# Patient Record
Sex: Female | Born: 1970 | Race: White | Hispanic: No | Marital: Married | Smoking: Never smoker
Health system: Southern US, Community
[De-identification: ages and names within clinical notes are randomized; demographics above are authoritative.]

## PROBLEM LIST (undated history)

## (undated) DIAGNOSIS — J45909 Unspecified asthma, uncomplicated: Secondary | ICD-10-CM

## (undated) DIAGNOSIS — M1990 Unspecified osteoarthritis, unspecified site: Secondary | ICD-10-CM

## (undated) DIAGNOSIS — E079 Disorder of thyroid, unspecified: Secondary | ICD-10-CM

## (undated) DIAGNOSIS — I10 Essential (primary) hypertension: Secondary | ICD-10-CM

## (undated) DIAGNOSIS — R87619 Unspecified abnormal cytological findings in specimens from cervix uteri: Secondary | ICD-10-CM

## (undated) DIAGNOSIS — N6091 Unspecified benign mammary dysplasia of right breast: Secondary | ICD-10-CM

## (undated) DIAGNOSIS — T7840XA Allergy, unspecified, initial encounter: Secondary | ICD-10-CM

## (undated) DIAGNOSIS — R519 Headache, unspecified: Secondary | ICD-10-CM

## (undated) DIAGNOSIS — B009 Herpesviral infection, unspecified: Secondary | ICD-10-CM

## (undated) DIAGNOSIS — N979 Female infertility, unspecified: Secondary | ICD-10-CM

## (undated) DIAGNOSIS — F419 Anxiety disorder, unspecified: Secondary | ICD-10-CM

## (undated) DIAGNOSIS — E039 Hypothyroidism, unspecified: Secondary | ICD-10-CM

## (undated) DIAGNOSIS — IMO0002 Reserved for concepts with insufficient information to code with codable children: Secondary | ICD-10-CM

## (undated) DIAGNOSIS — R51 Headache: Secondary | ICD-10-CM

## (undated) DIAGNOSIS — F319 Bipolar disorder, unspecified: Secondary | ICD-10-CM

## (undated) DIAGNOSIS — N809 Endometriosis, unspecified: Secondary | ICD-10-CM

## (undated) HISTORY — PX: OTHER SURGICAL HISTORY: SHX169

## (undated) HISTORY — PX: BREAST BIOPSY: SHX20

## (undated) HISTORY — PX: AUGMENTATION MAMMAPLASTY: SUR837

## (undated) HISTORY — DX: Endometriosis, unspecified: N80.9

## (undated) HISTORY — DX: Disorder of thyroid, unspecified: E07.9

## (undated) HISTORY — DX: Reserved for concepts with insufficient information to code with codable children: IMO0002

## (undated) HISTORY — PX: BREAST EXCISIONAL BIOPSY: SUR124

## (undated) HISTORY — PX: COLONOSCOPY: SHX174

## (undated) HISTORY — DX: Female infertility, unspecified: N97.9

## (undated) HISTORY — DX: Unspecified abnormal cytological findings in specimens from cervix uteri: R87.619

## (undated) HISTORY — DX: Allergy, unspecified, initial encounter: T78.40XA

## (undated) HISTORY — DX: Anxiety disorder, unspecified: F41.9

## (undated) HISTORY — DX: Herpesviral infection, unspecified: B00.9

## (undated) HISTORY — PX: SINUSOTOMY: SHX291

---

## 1997-08-17 ENCOUNTER — Other Ambulatory Visit: Admission: RE | Admit: 1997-08-17 | Discharge: 1997-08-17 | Payer: Self-pay | Admitting: *Deleted

## 1998-07-16 ENCOUNTER — Encounter (INDEPENDENT_AMBULATORY_CARE_PROVIDER_SITE_OTHER): Payer: Self-pay | Admitting: *Deleted

## 1998-07-16 ENCOUNTER — Ambulatory Visit (HOSPITAL_COMMUNITY): Admission: RE | Admit: 1998-07-16 | Discharge: 1998-07-16 | Payer: Self-pay | Admitting: Obstetrics and Gynecology

## 1998-07-17 ENCOUNTER — Other Ambulatory Visit: Admission: RE | Admit: 1998-07-17 | Discharge: 1998-07-17 | Payer: Self-pay | Admitting: *Deleted

## 1998-12-14 ENCOUNTER — Ambulatory Visit (HOSPITAL_COMMUNITY): Admission: RE | Admit: 1998-12-14 | Discharge: 1998-12-14 | Payer: Self-pay | Admitting: Obstetrics and Gynecology

## 1999-07-03 ENCOUNTER — Other Ambulatory Visit: Admission: RE | Admit: 1999-07-03 | Discharge: 1999-07-03 | Payer: Self-pay | Admitting: Obstetrics and Gynecology

## 2000-07-07 ENCOUNTER — Other Ambulatory Visit: Admission: RE | Admit: 2000-07-07 | Discharge: 2000-07-07 | Payer: Self-pay | Admitting: Obstetrics and Gynecology

## 2001-01-18 ENCOUNTER — Other Ambulatory Visit: Admission: RE | Admit: 2001-01-18 | Discharge: 2001-01-18 | Payer: Self-pay | Admitting: Obstetrics and Gynecology

## 2001-07-13 ENCOUNTER — Other Ambulatory Visit: Admission: RE | Admit: 2001-07-13 | Discharge: 2001-07-13 | Payer: Self-pay | Admitting: Obstetrics and Gynecology

## 2001-07-19 ENCOUNTER — Encounter: Admission: RE | Admit: 2001-07-19 | Discharge: 2001-07-19 | Payer: Self-pay | Admitting: Obstetrics and Gynecology

## 2001-07-19 ENCOUNTER — Encounter: Payer: Self-pay | Admitting: Obstetrics and Gynecology

## 2001-11-05 ENCOUNTER — Other Ambulatory Visit: Admission: RE | Admit: 2001-11-05 | Discharge: 2001-11-05 | Payer: Self-pay | Admitting: Obstetrics and Gynecology

## 2002-07-20 ENCOUNTER — Other Ambulatory Visit: Admission: RE | Admit: 2002-07-20 | Discharge: 2002-07-20 | Payer: Self-pay | Admitting: Obstetrics and Gynecology

## 2002-08-29 ENCOUNTER — Encounter: Payer: Self-pay | Admitting: Obstetrics and Gynecology

## 2002-08-29 ENCOUNTER — Encounter: Admission: RE | Admit: 2002-08-29 | Discharge: 2002-08-29 | Payer: Self-pay | Admitting: Obstetrics and Gynecology

## 2003-07-26 ENCOUNTER — Other Ambulatory Visit: Admission: RE | Admit: 2003-07-26 | Discharge: 2003-07-26 | Payer: Self-pay | Admitting: Obstetrics and Gynecology

## 2003-09-11 ENCOUNTER — Encounter: Admission: RE | Admit: 2003-09-11 | Discharge: 2003-09-11 | Payer: Self-pay | Admitting: Obstetrics and Gynecology

## 2003-09-18 ENCOUNTER — Encounter: Admission: RE | Admit: 2003-09-18 | Discharge: 2003-09-18 | Payer: Self-pay | Admitting: Obstetrics and Gynecology

## 2004-07-05 ENCOUNTER — Other Ambulatory Visit: Admission: RE | Admit: 2004-07-05 | Discharge: 2004-07-05 | Payer: Self-pay | Admitting: Obstetrics and Gynecology

## 2004-09-16 ENCOUNTER — Encounter: Admission: RE | Admit: 2004-09-16 | Discharge: 2004-09-16 | Payer: Self-pay | Admitting: Obstetrics and Gynecology

## 2004-12-21 ENCOUNTER — Inpatient Hospital Stay (HOSPITAL_COMMUNITY): Admission: AD | Admit: 2004-12-21 | Discharge: 2004-12-21 | Payer: Self-pay | Admitting: Obstetrics and Gynecology

## 2005-07-03 ENCOUNTER — Inpatient Hospital Stay (HOSPITAL_COMMUNITY): Admission: RE | Admit: 2005-07-03 | Discharge: 2005-07-07 | Payer: Self-pay | Admitting: Obstetrics and Gynecology

## 2005-07-03 ENCOUNTER — Encounter (INDEPENDENT_AMBULATORY_CARE_PROVIDER_SITE_OTHER): Payer: Self-pay | Admitting: Specialist

## 2005-07-14 ENCOUNTER — Ambulatory Visit: Admission: RE | Admit: 2005-07-14 | Discharge: 2005-07-14 | Payer: Self-pay | Admitting: Obstetrics and Gynecology

## 2005-08-14 ENCOUNTER — Other Ambulatory Visit: Admission: RE | Admit: 2005-08-14 | Discharge: 2005-08-14 | Payer: Self-pay | Admitting: Obstetrics and Gynecology

## 2006-04-13 ENCOUNTER — Encounter: Admission: RE | Admit: 2006-04-13 | Discharge: 2006-04-13 | Payer: Self-pay | Admitting: Obstetrics and Gynecology

## 2006-08-05 ENCOUNTER — Ambulatory Visit (HOSPITAL_COMMUNITY): Admission: RE | Admit: 2006-08-05 | Discharge: 2006-08-05 | Payer: Self-pay | Admitting: Obstetrics and Gynecology

## 2006-10-15 ENCOUNTER — Ambulatory Visit (HOSPITAL_BASED_OUTPATIENT_CLINIC_OR_DEPARTMENT_OTHER): Admission: RE | Admit: 2006-10-15 | Discharge: 2006-10-15 | Payer: Self-pay | Admitting: Orthopedic Surgery

## 2007-04-14 ENCOUNTER — Encounter: Admission: RE | Admit: 2007-04-14 | Discharge: 2007-04-14 | Payer: Self-pay | Admitting: Obstetrics and Gynecology

## 2007-05-03 ENCOUNTER — Encounter: Admission: RE | Admit: 2007-05-03 | Discharge: 2007-05-03 | Payer: Self-pay | Admitting: Obstetrics and Gynecology

## 2008-04-28 ENCOUNTER — Encounter: Admission: RE | Admit: 2008-04-28 | Discharge: 2008-04-28 | Payer: Self-pay | Admitting: Obstetrics and Gynecology

## 2009-04-30 ENCOUNTER — Encounter: Admission: RE | Admit: 2009-04-30 | Discharge: 2009-04-30 | Payer: Self-pay | Admitting: Obstetrics and Gynecology

## 2010-02-17 ENCOUNTER — Encounter: Payer: Self-pay | Admitting: Obstetrics and Gynecology

## 2010-05-30 ENCOUNTER — Other Ambulatory Visit: Payer: Self-pay | Admitting: Obstetrics and Gynecology

## 2010-05-30 DIAGNOSIS — Z1231 Encounter for screening mammogram for malignant neoplasm of breast: Secondary | ICD-10-CM

## 2010-06-10 ENCOUNTER — Ambulatory Visit
Admission: RE | Admit: 2010-06-10 | Discharge: 2010-06-10 | Disposition: A | Discharge status: Home / Self Care | Payer: BC Managed Care – PPO | Source: Ambulatory Visit | Attending: Obstetrics and Gynecology | Admitting: Obstetrics and Gynecology

## 2010-06-10 DIAGNOSIS — Z1231 Encounter for screening mammogram for malignant neoplasm of breast: Secondary | ICD-10-CM

## 2010-06-11 NOTE — Op Note (Signed)
Jessica Navarro, BROADEN NO.:  [id number]   MEDICAL RECORD NO.:  [id number]          PATIENT TYPE:  AMB   LOCATION:  [facility]                         FACILITY:  [facility]   PHYSICIAN:  Deidre Ala, M.D.    DATE OF BIRTH:  November 17, 1970   DATE OF PROCEDURE:  10/15/2006  DATE OF DISCHARGE:                               OPERATIVE REPORT   PREOPERATIVE DIAGNOSIS:  Painful osteophyte right fifth toe proximal  interphalangeal joint dorsal lateral surface.   POSTOPERATIVE DIAGNOSIS:  Painful osteophyte right fifth toe proximal  interphalangeal joint dorsal lateral surface.   PROCEDURE:  Phalangeal resection of right phalangeal base of distal  right proximal interphalangeal joint proximal phalanx with osteophyte  removal and soft tissue excision and repair.   SURGEON:  1. Charlesetta Shanks, M.D.   ASSISTANT:  Phineas Semen, P.A.-C.   ANESTHESIA:  Ankle block.   CULTURES:  None.   DRAINS:  None.   ESTIMATED BLOOD LOSS:  Minimal.   TOURNIQUET TIME:  Esmarch, 21 minutes.   PATHOLOGIC FINDINGS AND HISTORY:  Jessica Navarro is having trouble wearing shoes  due to this painful osteophyte with overlying soft tissue callus.  We  ellipsed the skin out to contract it over top and dissected down to the  osteophyte on the dorsolateral surface of the phalangeal base of the  distal portion of the proximal phalanx of the PIP joint, completely  smoothing it with a rongeur and rasp.   PROCEDURE:  After adequate anesthesia obtained using ankle block  technique and 1 gram of Ancef given IV for prophylaxis, the patient was  placed in the supine position.  The right foot was prepped and draped  from the toes to the ankle in the standard fashion.  After standard  prepping and draping, Esmarch exsanguination was used and left on above  the ankle.  I then made an elliptical incision, ellipsing a small amount  of skin out over the hard osteophyte at the PIP joint dorsal lateral  surface and dissected down  under loupe magnification.  I then retracted  and removed with a rongeur and rasp the osteophyte, irrigated and closed  the skin with vertical mattress sutures of 4-0 nylon.  This left smooth,  somewhat contracted skin over the area where it had been expanded by the  osteophyte.  Bulky sterile compressive dressing was applied with Ace.  The patient having tolerated the procedure well, was awakened  and taken to the recovery room in satisfactory condition to be  discharged per outpatient routine, crutches, weightbearing as tolerated  on her heel, Vicodin for pain and a wooden sole shoe.  Told to call the  office for appointment for recheck on Monday.           ______________________________  V. Charlesetta Shanks, M.D.     VEP/MEDQ  D:  10/15/2006  T:  10/15/2006  Job:  [id number]

## 2010-06-14 NOTE — Op Note (Signed)
Jessica Navarro, Jessica Navarro           ACCOUNT NO.:  [id number]   MEDICAL RECORD NO.:  [id number]          PATIENT TYPE:  INP   LOCATION:  9146                          FACILITY:  [facility]   PHYSICIAN:  Dois Davenport A. Rivard, M.D. DATE OF BIRTH:  [birth year]   DATE OF PROCEDURE:  07/03/2005  DATE OF DISCHARGE:                                 OPERATIVE REPORT   PREOPERATIVE DIAGNOSES:  1.  Intrauterine pregnancy at 36 weeks  day.  2.  Twins, breech and breech presentation.   POSTOPERATIVE DIAGNOSES:  1.  Intrauterine pregnancy at 36 weeks  day.  2.  Twins, breech and breech presentation.   ANESTHESIA:  Spinal, Quillian Quince, M.D.   PROCEDURE:  Primary low transverse cesarean section.   SURGEON:  Crist Fat. Rivard, M.D.   ASSISTANTRenaldo Reel. Latham, C.N.M.   ESTIMATED BLOOD LOSS:  600 mL.   PROCEDURE:  After being informed of the planned procedure with possible  complications including bleeding, infection, injury to bowel, bladder or  ureters, informed consent is obtained.  The patient is taken to cesarean  suite, given spinal anesthesia without complications and placed the dorsal  decubitus position, pelvis tilted to the left.  She is prepped and draped in  a sterile fashion and a Foley catheter is inserted in her bladder.   After assessing adequate level of anesthesia, we infiltrate the suprapubic  area using 20 mL of Marcaine 0.25T and perform a Pfannenstiel incision,  which was brought down to the fascia.  Fascia is then incised in a low  transverse fashion.  Linea alba is dissected.  Peritoneum is entered sharply  in a midline fashion.  Visceral peritoneum is entered in a low transverse  fashion, allowing [place] to safely retract bladder by developing a bladder flap,  and the myometrium is then entered in a low transverse fashion, first with  knife, then extended bluntly.  Amniotic fluid of baby A is clear.  We assist  the birth of a female infant in the complete breech presentation at  10:45 a.m.  Mouth and nose are suctioned.  Cord is clamped with a cord clamp and a Kelly  and sectioned and the baby is given to the pediatrician present in the room.  Amniotic fluid of baby B is clear.  We assist the birth of a female infant  born at 10:46 a.m. and also complete breech presentation.  Mouth and nose  are suctioned.  Cord is clamped with two Kelly clamps and sectioned and baby  is given to the pediatrician present in the room.  We then proceed with cord  blood drawing for private cord blood banking on both babies.  Eight  milliliters of blood is then drawn from the and umbilical vein for baby B  and 7 mL from baby A for labs.  Placentas are then delivered spontaneously.  Both placentas are complete, both blood cords have three vessels, and these  are sent for pathology.  The uterine revision is negative.  Ancef 2 g is  given to the patient and we proceed with closure of the myometrium in two  layers,  first with a running locked suture of 0 Vicryl, then with a Lembert  suture of 0 Vicryl imbricating the first one.  Hemostasis is completed on  peritoneal edges with cautery.  Both paracolic gutters are cleaned.  Both  tubes and ovaries are assessed and normal.  Appendix is also visualized and  normal.  The pelvis is then irrigated profusely with warm saline.  Hemostasis is rechecked and deemed adequate.   Under fascia hemostasis is completed with cautery and the fascia is closed  with two running sutures of #1 Vicryl meeting midline.  The wound is then  irrigated with warm saline.  Hemostasis is completed with cautery and the  skin is closed with a subcuticular suture of 3-0 Monocryl and Steri-Strips.   Instrument and sponge count is complete x2.  Estimated blood loss is 600 mL.  The procedure is very well tolerated by the patient, who is taken to  recovery room in a well and stable condition.   Baby A, a little boy, was named Jean Rosenthal, was born at 10:45 a.m., weighed 5   pounds 3 ounces and received an Apgar of 8 at one minute and 9 at five  minutes.  Baby B, a little girl named [name], was born at 10:46, weighed 4  pounds 15 ounces and received an Apgar of 8 at one minute and 9 at five  minutes.      Crist Fat Rivard, M.D.  Electronically Signed     SAR/MEDQ  D:  07/03/2005  T:  07/04/2005  Job:  [id number]

## 2010-06-14 NOTE — Discharge Summary (Signed)
[name], Jessica Navarro           ACCOUNT NO.:  [id number]   MEDICAL RECORD NO.:  [id number]          PATIENT TYPE:  INP   LOCATION:  9146                          [facility]ACILITY[facility]   PHYSICIAN:  Dois Davenport A. Rivard, M.D. DATE OF BIRTH:  08/16/1970   DATE OF ADMISSION:  07/03/2005  DATE OF DISCHARGE:  07/07/2005                                 DISCHARGE SUMMARY   ADDENDUM:    Discharge summary as written yesterday with the patient experiencing  difficulties with migraine headaches and baby is unable to be discharged due  to growing and feeding issues.  The patient worked with the Hotel manager yesterday and will continue today as babies are evaluated for  discharge.  She took medication for migraine headaches yesterday morning and  has been migraine-free since that time.  Her incision remains clean, dry and  intact.  Her vital signs are stable.  She is afebrile and on this, her  fourth postoperative day, she will be discharged in stable condition.      Rica Koyanagi, C.N.M.      Crist Fat Rivard, M.D.  Electronically Signed    SDM/MEDQ  D:  07/07/2005  T:  07/07/2005  Job:  [id number]

## 2010-06-14 NOTE — H&P (Signed)
NAMEJADZIA, IBSEN NO.:  [id number]   MEDICAL RECORD NO.:  [id number]          PATIENT TYPE:  INP   LOCATION:  NA                            [facility]   PHYSICIAN:  Dois Davenport A. Rivard, M.D. DATE OF BIRTH:  12-06-1970   DATE OF ADMISSION:  07/03/2005  DATE OF DISCHARGE:                                HISTORY & PHYSICAL   Ms. Mick is a 40 year old, gravida 1, para 0, at 36-1/7 weeks, who  presents for scheduled primary cesarean section secondary to twin gestation  with fetuses in a breach-breach presentation.   Pregnancy has been remarkable for:  1.  Advanced maternal age with patient having first trimester screening.      She declined amniocentesis.  2.  In vitro fertilization pregnancy.  3.  Migraines.  4.  History of HSV-2.  5.  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   PRENATAL LABORATORY DATA:  Blood type O positive. Rh antibody negative. VDRL  nonreactive.  Rubella titer positive. Hepatitis B surface antigen negative.  HIV nonreactive.  Varicella titer was immune.  Cystic fibrosis testing  showed the patient to be a carrier, but the father of the baby is negative.  GC and chlamydia cultures were negative in August.  Pap was normal in 2006.  Hemoglobin upon entering the practice was 13.1.  It was within normal limits  at 28 weeks.  Group B strep culture not noted on patient's chart at present.  EDC of July 30, 2005, was established by ultrasound at 9 weeks.  Glucola was  normal.  First trimester screening was normal.  She declined quadruple  screen.   HISTORY OF PRESENT PREGNANCY:  The patient entered care at approximately 12  weeks.  She had achieved a twin pregnancy via in vitro fertilization with  the [facility] program and Dr. Chevis Pretty as her infertility physician.  First  trimester screening was requested and was done.  It was normal.  Migraines  were significant prior to pregnancy and at the beginning of pregnancy.  They  did  improve somewhat. She did have trigger point injections.  She had an  ultrasound at approximately 9 weeks with Dr. Chevis Pretty with an [facility] given of July 30, 2005.  She had some lower abdominal cramping at 15 weeks.  Findings were  within normal limits.  She had an ultrasound on January 7 at [facility]  showing a diachronic diamnionic twin pregnancy with normal fetal anatomy x2,  normal nuchal translucency x2, and normal amniotic fluid volume.  She had  worsening migraine in January. This was treated by the headache center.  She  began to use Demerol and Phenergan under doctor's care. She had an  ultrasound at 13 weeks showing again congruency with an [facility] of July 30, 2005.  At 18 weeks, twin A was in a vertex presentation.  Placenta was posterior.  Twin B was vertex as well with posterior placenta.  Down's syndrome risk  diminished to 1:10,000 x2 on that ultrasound. Cervix was 3.91 cm long.  She  got a prescription for Valtrex therapy at 22 weeks.  She  was having more  frequent outbreaks and began to take Valtrex every day at approximately 22  weeks.  She had another ultrasound at 24 to 25 weeks showing normal growth  of both babies.  A was breach; B was vertex.  Cervix was normal.  At 29  weeks, she had a Glucola that was normal.  She was placed on iron with  slightly low iron.  She had another ultrasound at 29 weeks showing A breach  and B transverse with normal growth.  Cervix was closed and long.  At 33-  week ultrasound she also had, twin A was breach at 4 pounds 1 ounce at 67%.  Twin B was breach at 4 pounds 8 ounces at 35 percentile, normal fluid.  She  had decided to try Depo-Provera after delivery followed by __________, and  the decision was made to proceed with scheduled C section on June 7.  She  was declining circumcision, and she was considering private cord blood  banking.  She had an ultrasound at 35 weeks showing twin A at 5 pounds 10  ounces, normal fluid, and breach  presentation.  B: 5 pounds 9 ounces, normal  fluid, and breach presentation.  Cervix was 3 cm long.   OBSTETRICAL HISTORY:  The patient is a primigravida.  She did have in vitro  fertilization performed on November 06, 2004.  She was seeing Dr. Estanislado Pandy in  reach in [place].   PAST MEDICAL HISTORY:  1.  She had a laparoscopy for endometriosis in 2000.  2.  She was diagnosed with HSV about 7 years ago.  3.  She was a previous contraceptive user x2 years.  4.  She had an abnormal Pap in 2004.  5.  She has history of childhood asthma.  6.  She has had one bladder infection in the last year.  7.  She does have history of migraines.   PAST SURGICAL HISTORY:  Includes wisdom teeth extraction in 1990.   ALLERGIES:  She is allergic to SULFA.  She also has some mild sensitivity to  LATEX.   FAMILY HISTORY:  Her brother has chronic hypertension.  Her mother has  tuberculosis and has been treated.  Her paternal grandfather was diabetic.  Her brother had parathyroid removed.  Brother also has kidney problems and  is on dialysis.  Her mother and father had migraines.  Her brother had  seizure disorder as a child. Paternal grandfather had stomach cancer.  Her  mother is a smoker.  Her mother is also adopted.   GENETIC HISTORY:  Remarkable for father of the baby having a heart murmur  and the patient's father being a twin.   SOCIAL HISTORY:  The patient is married to the father of the baby.  He is  involved and supportive.  His name is Radiographer, therapeutic. The patient has  years of cosmetology school, and she is employed as a Associate Professor.  Her  husband has an 11th grade education.  He is employed as a Quarry manager.  She  has been followed by the physician's service at [facility].  She  denies any alcohol, drug, or tobacco use during this pregnancy.   PHYSICAL EXAMINATION:  VITAL SIGNS: Stable.  The patient is afebrile.  HEENT: Within normal limits. LUNGS:  Breath sounds are clear.   HEART: Regular rate and rhythm without murmur.  BREASTS: Soft and nontender.  ABDOMEN: Fundal height is approximately 40 cm.  Estimated fetal weights are  in the high 5  pound range.  The patient has very occasional contractions.  PELVIC:  Exam is deferred.  Fetal heart tones were in the 130s on last  examination in the office.  EXTREMITIES:  Deep tendon reflexes are 2+ without clonus.  There is trace  edema noted.   IMPRESSION:  1.  Intrauterine twin pregnancy at 36-1/7 weeks.  2.  Breach-breach presentation with patient desiring scheduled cesarean      section.  3.  History of herpesvirus-2 with no recent or current lesions.  4.  History of migraines.  5.  In vitro fertilization pregnancy.  6.  The patient is a cystic fibrosis carrier with father of the baby      negative.   PLAN:  1.  Admit to [facility] for consult with Dr. Silverio Lay as attending physician.  2.  Routine physician preoperative orders.      Renaldo Reel Emilee Hero, C.N.M.      Crist Fat Rivard, M.D.  Electronically Signed    VLL/MEDQ  D:  07/01/2005  T:  07/01/2005  Job:  [id number]

## 2010-06-14 NOTE — Discharge Summary (Signed)
Jessica Navarro, BURKEL NO.:  [id number]   MEDICAL RECORD NO.:  [id number]          PATIENT TYPE:  INP   LOCATION:  9146                          [facility]ACILITY[facility]   PHYSICIAN:  Hal Morales, M.D.DATE OF BIRTH:  1970/05/05   DATE OF ADMISSION:  07/03/2005  DATE OF DISCHARGE:  07/06/2005                                 DISCHARGE SUMMARY   ADMITTING DIAGNOSIS:  Intrauterine twin gestation at 36-1/7 weeks, breech  presentation with the patient desiring scheduled cesarean section.   PROCEDURE:  Primary low transverse cesarean section.   DISCHARGE DIAGNOSIS:  Intrauterine pregnancy at 36-1/7 weeks, delivered twin  gestation, breechpresentation.   HOSPITAL COURSE:  [name] is a 40 year old, gravida 1, para 0, who  presented at 36-1/7 weeks for scheduled primary cesarean delivery secondary  to twins at gestation with a breechpresentation.  This was performed on July 03, 2005 by Dr. Dois Davenport Rivard.  The patient gave birth to baby A, Jean Rosenthal  with Apgar scores of 8 at 1 minute 9 at 5 minutes.  Baby weighed 5 pounds 3  ounces and has done well in the postoperative period.  Baby B, Isabell,  Apgar scores 8 at 1 minute and 9 at  minutes with weight 4 pounds 15 ounces.  Babies have done well in the postoperative period as well.  The patient has  done well.  Her vital signs remained stable.  She is afebrile.  The only  complication has been migraine headaches, which the patient has had  throughout her pregnancy and has a history prior to pregnancy.  She has used  [facility] effectively through her pregnancy, as she has been seen by  [facility].  She was therefore ordered  this medication in the postoperative period and it has worked well.  On this  her fourth postoperative day her vital signs are stable.  She is afebrile.  Her incision is clean, dry and intact.  Her babies are well and ready for  discharge on the fourth, rather  than the third postoperative day, and the  patient is deemed to be in satisfactory condition for discharge.   DISCHARGE INSTRUCTIONS:  Per [facility] handout.   DISCHARGE MEDICATIONS:  1.  Motrin 600 mg p.o. q.6 h., p.r.n. pain.  2.  Tylox 1-2 p.o. q.3-4h pain.  3.  Prenatal vitamins.  4.  The patient was given a prescription for [facility], 1 p.o. q.4-      6h., p.r.n. migraine headaches.   The patient will follow up with [facility] as  needed.  She will follow up at the office of [facility] in 6 weeks or p.r.n.      Rica Koyanagi, C.N.M.      Hal Morales, M.D.  Electronically Signed    SDM/MEDQ  D:  07/06/2005  T:  07/07/2005  Job:  [id number]

## 2010-11-07 LAB — POCT HEMOGLOBIN-HEMACUE: Operator id: 134391

## 2010-11-07 LAB — POCT PREGNANCY, URINE
Operator id: 134391
Preg Test, Ur: NEGATIVE

## 2011-04-14 ENCOUNTER — Encounter (INDEPENDENT_AMBULATORY_CARE_PROVIDER_SITE_OTHER): Payer: BC Managed Care – PPO | Admitting: Obstetrics and Gynecology

## 2011-04-14 DIAGNOSIS — N949 Unspecified condition associated with female genital organs and menstrual cycle: Secondary | ICD-10-CM

## 2011-05-13 ENCOUNTER — Other Ambulatory Visit: Payer: Self-pay | Admitting: Obstetrics and Gynecology

## 2011-05-13 DIAGNOSIS — Z1231 Encounter for screening mammogram for malignant neoplasm of breast: Secondary | ICD-10-CM

## 2011-06-12 ENCOUNTER — Ambulatory Visit: Payer: BC Managed Care – PPO

## 2011-06-25 ENCOUNTER — Ambulatory Visit: Payer: BC Managed Care – PPO

## 2011-08-25 ENCOUNTER — Ambulatory Visit
Admission: RE | Admit: 2011-08-25 | Discharge: 2011-08-25 | Disposition: A | Discharge status: Home / Self Care | Payer: BC Managed Care – PPO | Source: Ambulatory Visit | Attending: Obstetrics and Gynecology | Admitting: Obstetrics and Gynecology

## 2011-08-25 DIAGNOSIS — Z1231 Encounter for screening mammogram for malignant neoplasm of breast: Secondary | ICD-10-CM

## 2011-08-26 ENCOUNTER — Other Ambulatory Visit: Payer: Self-pay | Admitting: Obstetrics and Gynecology

## 2011-08-26 ENCOUNTER — Telehealth: Payer: Self-pay

## 2011-08-26 DIAGNOSIS — N644 Mastodynia: Secondary | ICD-10-CM

## 2011-08-26 NOTE — Telephone Encounter (Signed)
Referral needed for diagnostic mmg due to focal pain at yearly mmg appt.  Fax sent to [email or web address]. ld

## 2011-09-04 ENCOUNTER — Ambulatory Visit
Admission: RE | Admit: 2011-09-04 | Discharge: 2011-09-04 | Disposition: A | Discharge status: Home / Self Care | Payer: BC Managed Care – PPO | Source: Ambulatory Visit | Attending: Obstetrics and Gynecology | Admitting: Obstetrics and Gynecology

## 2011-09-04 ENCOUNTER — Other Ambulatory Visit: Payer: Self-pay | Admitting: Obstetrics and Gynecology

## 2011-09-04 DIAGNOSIS — N644 Mastodynia: Secondary | ICD-10-CM

## 2011-09-04 NOTE — Progress Notes (Signed)
Quick Note:  Please send "Dense breast" letter to patient and document in chart when letter is sent. ______ 

## 2011-09-05 ENCOUNTER — Encounter: Payer: Self-pay | Admitting: Obstetrics and Gynecology

## 2011-09-24 ENCOUNTER — Encounter: Payer: Self-pay | Admitting: Obstetrics and Gynecology

## 2011-09-30 ENCOUNTER — Telehealth: Payer: Self-pay | Admitting: Obstetrics and Gynecology

## 2011-09-30 NOTE — Telephone Encounter (Signed)
Triage/quest. Res.

## 2011-10-01 ENCOUNTER — Telehealth: Payer: Self-pay

## 2011-10-01 NOTE — Telephone Encounter (Signed)
Pt was called regarding incorrect letter. Letter sent in error. Pt voice understanding. Jessica Navarro

## 2011-10-15 ENCOUNTER — Encounter: Payer: Self-pay | Admitting: Obstetrics and Gynecology

## 2011-10-15 ENCOUNTER — Ambulatory Visit (INDEPENDENT_AMBULATORY_CARE_PROVIDER_SITE_OTHER): Payer: BC Managed Care – PPO | Admitting: Obstetrics and Gynecology

## 2011-10-15 VITALS — BP 110/76 | Ht 62.0 in | Wt 130.0 lb

## 2011-10-15 DIAGNOSIS — Z01419 Encounter for gynecological examination (general) (routine) without abnormal findings: Secondary | ICD-10-CM

## 2011-10-15 NOTE — Progress Notes (Signed)
The patient reports:normal menses, no abnormal bleeding, pelvic pain or discharge  Contraception:IUD  Last mammogram: was normal August 2013 Last pap: was normal August  2013  GC/Chlamydia cultures offered: declined HIV/RPR/HbsAg offered:  declined HSV 1 and 2 glycoprotein offered: declined  Menstrual cycle regular and monthly: No: IUD  Menstrual flow normal: No: IUD  Urinary symptoms: none Normal bowel movements: Yes Reports abuse at home: No:   C/o hot flashes  Subjective:    Jessica Navarro is a 41 y.o. female, No obstetric history on file., who presents for an annual exam.     History   Social History  . Marital Status: Married    Spouse Name: N/A    Number of Children: N/A  . Years of Education: N/A   Social History Main Topics  . Smoking status: Never Smoker   . Smokeless tobacco: Never Used  . Alcohol Use: 0.0 oz/week    1-2 drink(s) per week  . Drug Use: No  . Sexually Active: Yes    Birth Control/ Protection: IUD   Other Topics Concern  . None   Social History Narrative  . None    Menstrual cycle:   LMP: No LMP recorded. Patient is not currently having periods (Reason: IUD).           Cycle: amenorrhea  The following portions of the patient's history were reviewed and updated as appropriate: allergies, current medications, past family history, past medical history, past social history, past surgical history and problem list.  Review of Systems Pertinent items are noted in HPI. Breast:Negative for breast lump,nipple discharge or nipple retraction Gastrointestinal: Negative for abdominal pain, change in bowel habits or rectal bleeding Urinary:negative   Objective:    BP 110/76  Ht 5\' 2"  (1.575 m)  Wt 130 lb (58.968 kg)  BMI 23.78 kg/m2    Weight:  Wt Readings from Last 1 Encounters:  10/15/11 130 lb (58.968 kg)          BMI: Body mass index is 23.78 kg/(m^2).  General Appearance: Alert, appropriate appearance for age. No acute  distress HEENT: Grossly normal Neck / Thyroid: Supple, no masses, nodes or enlargement Lungs: clear to auscultation bilaterally Back: No CVA tenderness Breast Exam: No masses or nodes.No dimpling, nipple retraction or discharge. Cardiovascular: Regular rate and rhythm. S1, S2, no murmur Gastrointestinal: Soft, non-tender, no masses or organomegaly Pelvic Exam: Vulva and vagina appear normal. Bimanual exam reveals normal uterus and adnexa.Strings + Rectovaginal: normal rectal, no masses Lymphatic Exam: Non-palpable nodes in neck, clavicular, axillary, or inguinal regions Skin: no rash or abnormalities Neurologic: Normal gait and speech, no tremor  Psychiatric: Alert and oriented, appropriate affect.    Assessment:    Normal gyn exam    Plan:    mammogram pap smear return annually or prn STD screening: declined Contraception:IUD      Freida Busman JMD

## 2011-10-16 LAB — PAP IG W/ RFLX HPV ASCU

## 2012-07-22 ENCOUNTER — Other Ambulatory Visit: Payer: Self-pay

## 2012-07-22 DIAGNOSIS — Z1231 Encounter for screening mammogram for malignant neoplasm of breast: Secondary | ICD-10-CM

## 2012-09-13 ENCOUNTER — Ambulatory Visit: Payer: BC Managed Care – PPO

## 2012-10-04 ENCOUNTER — Ambulatory Visit
Admission: RE | Admit: 2012-10-04 | Discharge: 2012-10-04 | Disposition: A | Discharge status: Home / Self Care | Payer: BC Managed Care – PPO | Source: Ambulatory Visit

## 2012-10-04 DIAGNOSIS — Z1231 Encounter for screening mammogram for malignant neoplasm of breast: Secondary | ICD-10-CM

## 2013-03-07 ENCOUNTER — Ambulatory Visit (INDEPENDENT_AMBULATORY_CARE_PROVIDER_SITE_OTHER): Payer: BC Managed Care – PPO | Admitting: Internal Medicine

## 2013-03-07 ENCOUNTER — Other Ambulatory Visit: Payer: Self-pay | Admitting: Internal Medicine

## 2013-03-07 ENCOUNTER — Encounter: Payer: Self-pay | Admitting: Internal Medicine

## 2013-03-07 VITALS — BP 116/78 | HR 76 | Temp 97.9°F | Resp 12 | Ht 62.0 in | Wt 161.0 lb

## 2013-03-07 DIAGNOSIS — R5381 Other malaise: Secondary | ICD-10-CM

## 2013-03-07 DIAGNOSIS — R5383 Other fatigue: Secondary | ICD-10-CM

## 2013-03-07 DIAGNOSIS — E039 Hypothyroidism, unspecified: Secondary | ICD-10-CM | POA: Insufficient documentation

## 2013-03-07 NOTE — Patient Instructions (Addendum)
Please stop at the lab downstairs. Please return for another visit in 6 months. Try the Relizen for hot flushes. Here is some more info about menopause:   Menopause Menopause is the normal time of life when menstrual periods stop completely. Menopause is complete when you have missed 12 consecutive menstrual periods. It usually occurs between the ages of 48 years and 55 years. Very rarely does a woman develop menopause before the age of 40 years. At menopause, your ovaries stop producing the female hormones estrogen and progesterone. This can cause undesirable symptoms and also affect your health. Sometimes the symptoms may occur 4 5 years before the menopause begins. There is no relationship between menopause and:  Oral contraceptives.  Number of children you had.  Race.  The age your menstrual periods started (menarche). Heavy smokers and very thin women may develop menopause earlier in life. CAUSES  The ovaries stop producing the female hormones estrogen and progesterone.  Other causes include:  Surgery to remove both ovaries.  The ovaries stop functioning for no known reason.  Tumors of the pituitary gland in the brain.  Medical disease that affects the ovaries and hormone production.  Radiation treatment to the abdomen or pelvis.  Chemotherapy that affects the ovaries. SYMPTOMS   Hot flashes.  Night sweats.  Decrease in sex drive.  Vaginal dryness and thinning of the vagina causing painful intercourse.  Dryness of the skin and developing wrinkles.  Headaches.  Tiredness.  Irritability.  Memory problems.  Weight gain.  Bladder infections.  Hair growth of the face and chest.  Infertility. More serious symptoms include:  Loss of bone (osteoporosis) causing breaks (fractures).  Depression.  Hardening and narrowing of the arteries (atherosclerosis) causing heart attacks and strokes. DIAGNOSIS   When the menstrual periods have stopped for 12  straight months.  Physical exam.  Hormone studies of the blood. TREATMENT  There are many treatment choices and nearly as many questions about them. The decisions to treat or not to treat menopausal changes is an individual choice made with your health care provider. Your health care provider can discuss the treatments with you. Together, you can decide which treatment will work best for you. Your treatment choices may include:   Hormone therapy (estrogen and progesterone).  Non-hormonal medicines.  Treating the individual symptoms with medicine (for example antidepressants for depression).  Herbal medicines that may help specific symptoms.  Counseling by a psychiatrist or psychologist.  Group therapy.  Lifestyle changes including:  Eating healthy.  Regular exercise.  Limiting caffeine and alcohol.  Stress management and meditation.  No treatment. HOME CARE INSTRUCTIONS   Take the medicine your health care provider gives you as directed.  Get plenty of sleep and rest.  Exercise regularly.  Eat a diet that contains calcium (good for the bones) and soy products (acts like estrogen hormone).  Avoid alcoholic beverages.  Do not smoke.  If you have hot flashes, dress in layers.  Take supplements, calcium, and vitamin D to strengthen bones.  You can use over-the-counter lubricants or moisturizers for vaginal dryness.  Group therapy is sometimes very helpful.  Acupuncture may be helpful in some cases. SEEK MEDICAL CARE IF:   You are not sure you are in menopause.  You are having menopausal symptoms and need advice and treatment.  You are still having menstrual periods after age 44 years.  You have pain with intercourse.  Menopause is complete (no menstrual period for 12 months) and you develop vaginal bleeding.  You  need a referral to a specialist (gynecologist, psychiatrist, or psychologist) for treatment. SEEK IMMEDIATE MEDICAL CARE IF:   You have  severe depression.  You have excessive vaginal bleeding.  You fell and think you have a broken bone.  You have pain when you urinate.  You develop leg or chest pain.  You have a fast pounding heart beat (palpitations).  You have severe headaches.  You develop vision problems.  You feel a lump in your breast.  You have abdominal pain or severe indigestion. Document Released: 04/05/2003 Document Revised: 09/15/2012 Document Reviewed: 08/12/2012 [facility] Patient Information 2014 [place], [place].

## 2013-03-07 NOTE — Progress Notes (Signed)
Patient ID: Jessica Navarro, female   DOB: 03-Feb-1970, 43 y.o.   MRN: [id number]   HPI  Jessica Navarro is a 43 y.o.-year-old female, self-referred for evaluation for hypothyroidism.  Pt. has been dx with hypothyroidism in 05/2012 (during investigation for weight gain); is on Levothyroxine 50 mcg, taken: - fasting - with water - separated by >30 min from b'fast  - separated by >4h from calcium, iron, PPIs, multivitamins   I reviewed pts TSH levels: 05/31/2012: 6.4 (.34-5.6) >> started LT4 25 07/20/2012: 9.0 >> started LT4 50 08/24/2012: 2.24 12/13/2012: 2.11  Pt denies feeling nodules in neck, hoarseness, dysphagia/odynophagia, SOB with lying down.  Pt describes: - no cold intolerance, but + hot flushes for >1 year ago -  - + weight gain: + 30 lbs in last 6 mo - + fatigue - no constipation - no dry skin - + hair falling - + depression - + low libido  - + poor sleep  She has + FH of thyroid disorders in: mother. No FH of thyroid cancer.  No h/o radiation tx to head or neck.  No recent use of iodine supplements. She takes B12 inj. No anemia. Has undergone hormonal tx before IVF in 2006.   ROS: Constitutional:see above Eyes: no blurry vision, no xerophthalmia ENT: no sore throat, no  nodules palpated in throat, no dysphagia/odynophagia, no hoarseness Cardiovascular: no CP/SOB/palpitations/leg swelling Respiratory: no cough/SOB Gastrointestinal: no N/V/D/C Musculoskeletal: no muscle/+ joint aches (back) Skin: no rashes Neurological: no tremors/numbness/tingling/dizziness, + HA Psychiatric: + depression (worst recently)/+ anxiety  Past Medical History  Diagnosis Date  . HSV-2 (herpes simplex virus 2) infection   . Infertility, female   . Abnormal pap   . Endometriosis   . Allergy   . Anxiety   . Thyroid disease     hypothyroidism   Past Surgical History  Procedure Laterality Date  . Sinusotomy    . Foot surgerey    . Cesarean section  2007   History    Social History  . Marital Status: Married    Spouse Name: N/A    Number of Children: 2, twins - post IVF   . Years of Education: N/A   Occupational History  . cosmetologist   Social History Main Topics  . Smoking status: Never Smoker   . Smokeless tobacco: Never Used  . Alcohol Use:     1-2 drink(s) per week  . Drug Use: No  . Sexual Activity: Yes    Birth Control/ Protection: IUD   Current Outpatient Prescriptions on File Prior to Visit  Medication Sig Dispense Refill  . ALPRAZolam (XANAX) 1 MG tablet Take 1 mg by mouth at bedtime as needed.      . butalbital-acetaminophen-caffeine (FIORICET WITH CODEINE) 50-325-40-30 MG per capsule Take 1 capsule by mouth every 4 (four) hours as needed.      Marland Kitchen levonorgestrel (MIRENA) 20 MCG/24HR IUD 1 each by Intrauterine route once.      Marland Kitchen oxyCODONE (OXYCONTIN) 10 MG 12 hr tablet Take 10 mg by mouth as needed.       . venlafaxine (EFFEXOR) 75 MG tablet Take 75 mg by mouth 2 (two) times daily.       No current facility-administered medications on file prior to visit.   Allergies  Allergen Reactions  . Sulfa Antibiotics    Family History  Problem Relation Age of Onset  . Fibromyalgia Mother   . Alzheimer's disease Paternal Grandmother   . Stomach cancer Paternal Grandfather 28  PE: BP 116/78  Pulse 76  Temp(Src) 97.9 F (36.6 C) (Oral)  Resp 12  Ht 5\' 2"  (1.575 m)  Wt 161 lb (73.029 kg)  BMI 29.44 kg/m2  SpO2 98% Wt Readings from Last 3 Encounters:  03/07/13 161 lb (73.029 kg)  10/15/11 130 lb (58.968 kg)   Constitutional: overweight (gynoid distribution), in NAD Eyes: PERRLA, EOMI, no exophthalmos ENT: moist mucous membranes, no thyromegaly, no cervical lymphadenopathy Cardiovascular: RRR, No MRG Respiratory: CTA B Gastrointestinal: abdomen soft, NT, ND, BS+ Musculoskeletal: no deformities, strength intact in all 4 Skin: moist, warm, no rashes Neurological: no tremor with outstretched hands, DTR normal in all  4  ASSESSMENT: 1. Hypothyroidism  2. Fatigue, weight gain  PLAN:  1. Patient with recently dx-ed mild hypothyroidism, on levothyroxine therapy. She appears euthyroid. - She does not appear to have a goiter, thyroid nodules, or neck compression symptoms - we'll check thyroid tests today: TSH, free T4, and add thyroid antibodies to check for Hashimoto's ds - If these are abnormal, she will need to return in 6-8 weeks for repeat labs - If these are normal, I will see her back in 6 months  2. Fatigue, weight gain - we discussed that she probably started menopause (weight gain, hot flushes, fatigue); we cannot check for this since she is on an IUD (placed last year).  - advised to improve diet and exercise - can try Relizen for hot flushes - she is on B12 supplements - will check a vit D level  CC: PCP: Richardson Dopp  Orders Only on 03/07/2013  Component Date Value Range Status  . TSH 03/07/2013 1.155  0.350 - 4.500 uIU/mL Final  . Free T4 03/07/2013 1.29  0.80 - 1.80 ng/dL Final  . Thyroid Peroxidase Antibody 03/07/2013 <10.0  <35.0 IU/mL Final   Comment:                            The thyroid microsomal antigen has been shown to be Thyroid                          Peroxidase (TPO).  This assay detects anti-TPO antibodies.  . Vit D, 25-Hydroxy 03/07/2013 15* 30 - 89 ng/mL Final   Comment: This assay accurately quantifies Vitamin D, which is the sum of the                          25-Hydroxy forms of Vitamin D2 and D3.  Studies have shown that the                          optimum concentration of 25-Hydroxy Vitamin D is 30 ng/mL or higher.                           Concentrations of Vitamin D between 20 and 29 ng/mL are considered to                          be insufficient and concentrations less than 20 ng/mL are considered                          to be deficient for Vitamin D.   Will advise to start vitamin D 2000 IU daily >>  recheck labs per pcp

## 2013-03-08 LAB — THYROID PEROXIDASE ANTIBODY: Thyroperoxidase Ab SerPl-aCnc: <10 IU/mL (ref <35.0)

## 2013-03-08 LAB — VITAMIN D 25 HYDROXY (VIT D DEFICIENCY, FRACTURES): VIT D 25 HYDROXY: 15 ng/mL — AB (ref 30–89)

## 2013-03-08 LAB — TSH: TSH: 1.155 u[IU]/mL (ref 0.350–4.500)

## 2013-03-08 LAB — T4, FREE: Free T4: 1.29 ng/dL (ref 0.80–1.80)

## 2013-08-11 ENCOUNTER — Other Ambulatory Visit: Payer: Self-pay

## 2013-08-11 DIAGNOSIS — Z1231 Encounter for screening mammogram for malignant neoplasm of breast: Secondary | ICD-10-CM

## 2013-09-05 ENCOUNTER — Encounter: Payer: Self-pay | Admitting: Internal Medicine

## 2013-09-05 ENCOUNTER — Ambulatory Visit (INDEPENDENT_AMBULATORY_CARE_PROVIDER_SITE_OTHER): Payer: BC Managed Care – PPO | Admitting: Internal Medicine

## 2013-09-05 ENCOUNTER — Ambulatory Visit: Payer: BC Managed Care – PPO | Admitting: Internal Medicine

## 2013-09-05 VITALS — BP 106/70 | HR 77 | Temp 98.1°F | Resp 12 | Wt 148.0 lb

## 2013-09-05 DIAGNOSIS — E559 Vitamin D deficiency, unspecified: Secondary | ICD-10-CM

## 2013-09-05 DIAGNOSIS — E039 Hypothyroidism, unspecified: Secondary | ICD-10-CM

## 2013-09-05 LAB — VITAMIN D 25 HYDROXY (VIT D DEFICIENCY, FRACTURES): VITD: 33.76 ng/mL (ref 30.00–100.00)

## 2013-09-05 LAB — T4, FREE: Free T4: 0.94 ng/dL (ref 0.60–1.60)

## 2013-09-05 LAB — TSH: TSH: 0.55 u[IU]/mL (ref 0.35–4.50)

## 2013-09-05 NOTE — Patient Instructions (Signed)
Please stop at the lab.  Please return in 1 year.  

## 2013-09-05 NOTE — Progress Notes (Signed)
Patient ID: Jessica Navarro, female   DOB: 1970/02/17, 43 y.o.   MRN: [id number]   HPI  Jessica Navarro is a 43 y.o.-year-old female, self-referred for evaluation for hypothyroidism. Last visit 6 mo ago.  Reviewed hx: Pt. has been dx with hypothyroidism in 05/2012 (during investigation for weight gain); is on Levothyroxine 50 mcg, taken: - fasting - with water - separated by >2h from b'fast  - separated by >4h from multivitamins  - no calcium, iron, PPIs,   I reviewed pts TSH levels:  Orders Only on 03/07/2013  Component Date Value Ref Range Status  . TSH 03/07/2013 1.155  0.350 - 4.500 uIU/mL Final  . Free T4 03/07/2013 1.29  0.80 - 1.80 ng/dL Final  . Thyroid Peroxidase Antibody 03/07/2013 <10.0  <35.0 IU/mL Final  Previously: 05/31/2012: 6.4 (.34-5.6) >> started LT4 25 07/20/2012: 9.0 >> started LT4 50 08/24/2012: 2.24 12/13/2012: 2.11  Pt denies feeling nodules in neck, hoarseness, dysphagia/odynophagia, SOB with lying down.  Pt describes that she feels much better than at last visit: - no cold intolerance, but + hot flushes  - improved - + weight loss: 15 lbs in last 6 mo! Through diet - less fatigue - no constipation - no dry skin - improved hair falling - after starting Biotin - improved depression  No recent use of iodine supplements. She takes B12 inj. No anemia. Has undergone hormonal tx before IVF in 2006.   Vitamin D def - last vit D level:  Vit D, 25-Hydroxy 03/07/2013 15* 30 - 89 ng/mL Final  >> I advised her to start Vit D 2000 IU >> she did not start, but started a MVI  She started Aderrall 4 mo ago. She decreased Abilify from 10 to 5 mg daily.  ROS: Constitutional:see above Eyes: no blurry vision, no xerophthalmia ENT: no sore throat, no  nodules palpated in throat, no dysphagia/odynophagia, no hoarseness Cardiovascular: no CP/SOB/palpitations/leg swelling Respiratory: no cough/SOB Gastrointestinal: no N/V/D/C Musculoskeletal: no  muscle/joint aches  Skin: no rashes Neurological: no tremors/numbness/tingling/dizziness  PE: BP 106/70  Pulse 77  Temp(Src) 98.1 F (36.7 C) (Oral)  Resp 12  Wt 148 lb (67.132 kg)  SpO2 99% Wt Readings from Last 3 Encounters:  09/05/13 148 lb (67.132 kg)  03/07/13 161 lb (73.029 kg)  10/15/11 130 lb (58.968 kg)   Constitutional: overweight (gynoid distribution), in NAD Eyes: PERRLA, EOMI, no exophthalmos ENT: moist mucous membranes, no thyromegaly, no cervical lymphadenopathy Cardiovascular: RRR, No MRG Respiratory: CTA B Gastrointestinal: abdomen soft, NT, ND, BS+ Musculoskeletal: no deformities, strength intact in all 4 Skin: moist, warm, no rashes Neurological: no tremor with outstretched hands, DTR normal in all 4  ASSESSMENT: 1. Hypothyroidism  2. Fatigue, weight gain  PLAN:  1. Patient with recently dx-ed mild hypothyroidism, on levothyroxine therapy. She appears euthyroid. We reviewed her latest TFTs and they were normal. Her TPO Abs were low. - She does not appear to have a goiter, thyroid nodules, or neck compression symptoms - we'll check thyroid tests today: TSH, free T4 - continue LT4 50 mcg daily  - she is taking it correctly. - If these are abnormal, she will need to return in 6-8 weeks for repeat labs Return in about 1 year (around 09/06/2014).  2. Vit D deficiency - only on a MVI daily - will recheck level today - we discussed that she may need a 2000 IU OTC vit D supplement or a high dose Ergocalciferol course.  CC: PCP: Richardson DoppJoyce Brown-Patram  Office  Visit on 09/05/2013  Component Date Value Ref Range Status  . TSH 09/05/2013 0.55  0.35 - 4.50 uIU/mL Final  . Free T4 09/05/2013 0.94  0.60 - 1.60 ng/dL Final  . VITD 16/10/9602 33.76  30.00 - 100.00 ng/mL Final  Labs normal.

## 2013-10-06 ENCOUNTER — Ambulatory Visit: Payer: BC Managed Care – PPO

## 2013-10-19 ENCOUNTER — Ambulatory Visit
Admission: RE | Admit: 2013-10-19 | Discharge: 2013-10-19 | Disposition: A | Discharge status: Home / Self Care | Payer: BC Managed Care – PPO | Source: Ambulatory Visit

## 2013-10-19 DIAGNOSIS — Z1231 Encounter for screening mammogram for malignant neoplasm of breast: Secondary | ICD-10-CM

## 2014-09-04 ENCOUNTER — Ambulatory Visit: Payer: BC Managed Care – PPO | Admitting: Internal Medicine

## 2014-11-02 ENCOUNTER — Other Ambulatory Visit: Payer: Self-pay

## 2014-11-02 DIAGNOSIS — Z1231 Encounter for screening mammogram for malignant neoplasm of breast: Secondary | ICD-10-CM

## 2014-11-24 ENCOUNTER — Ambulatory Visit
Admission: RE | Admit: 2014-11-24 | Discharge: 2014-11-24 | Disposition: A | Discharge status: Home / Self Care | Payer: BLUE CROSS/BLUE SHIELD | Source: Ambulatory Visit

## 2014-11-24 DIAGNOSIS — Z1231 Encounter for screening mammogram for malignant neoplasm of breast: Secondary | ICD-10-CM

## 2015-12-04 ENCOUNTER — Other Ambulatory Visit: Payer: Self-pay | Admitting: Obstetrics and Gynecology

## 2015-12-04 DIAGNOSIS — R928 Other abnormal and inconclusive findings on diagnostic imaging of breast: Secondary | ICD-10-CM

## 2015-12-10 ENCOUNTER — Other Ambulatory Visit: Payer: Self-pay | Admitting: Obstetrics and Gynecology

## 2015-12-10 ENCOUNTER — Ambulatory Visit
Admission: RE | Admit: 2015-12-10 | Discharge: 2015-12-10 | Disposition: A | Discharge status: Home / Self Care | Payer: BLUE CROSS/BLUE SHIELD | Source: Ambulatory Visit | Attending: Obstetrics and Gynecology | Admitting: Obstetrics and Gynecology

## 2015-12-10 DIAGNOSIS — R928 Other abnormal and inconclusive findings on diagnostic imaging of breast: Secondary | ICD-10-CM

## 2015-12-10 DIAGNOSIS — R921 Mammographic calcification found on diagnostic imaging of breast: Secondary | ICD-10-CM

## 2015-12-12 ENCOUNTER — Other Ambulatory Visit: Payer: Self-pay | Admitting: Obstetrics and Gynecology

## 2015-12-12 ENCOUNTER — Ambulatory Visit
Admission: RE | Admit: 2015-12-12 | Discharge: 2015-12-12 | Disposition: A | Discharge status: Home / Self Care | Payer: BLUE CROSS/BLUE SHIELD | Source: Ambulatory Visit | Attending: Obstetrics and Gynecology | Admitting: Obstetrics and Gynecology

## 2015-12-12 DIAGNOSIS — R921 Mammographic calcification found on diagnostic imaging of breast: Secondary | ICD-10-CM

## 2015-12-31 ENCOUNTER — Other Ambulatory Visit: Payer: Self-pay | Admitting: General Surgery

## 2015-12-31 DIAGNOSIS — N6091 Unspecified benign mammary dysplasia of right breast: Secondary | ICD-10-CM

## 2016-01-03 ENCOUNTER — Other Ambulatory Visit: Payer: Self-pay | Admitting: General Surgery

## 2016-01-03 DIAGNOSIS — N6091 Unspecified benign mammary dysplasia of right breast: Secondary | ICD-10-CM

## 2016-01-06 NOTE — H&P (Signed)
Jessica Navarro  Location: [facility] Patient #: [id number] DOB: 25-Mar-1970 Married / Language: English / Race: White Female        History of Present Illness   The patient is a 45 year old female who presents with a complaint of Breast problems.  She is  from [place]. Referred by Dr. Frederico Hamman at the [facility] for atypical lobular hyperplasia of the right breast, upper outer quadrant. Dr. [name] is her PCP. Melissa Alona Bene Brown-Patram,NP provides most of her routine primary care.      The patient has not had any serious breast problems in the past. She's had a couple of callbacks but no biopsies. Recent imaging studies an ultrasound showed category C density. In the upper outer right breast is a small area of calcifications. Largest area 5 mm and the posterior third. There was also some calcifications in distortion on the left side. Biopsy of the left side showed benign fibrocystic change and calcifications. This was felt to be concordant. On the right side, however she had atypical lobular hyperplasia and she was referred for surgical evaluation.     Comorbidities include hypertension. Depression and bipolar disorder well-controlled. Migraine headaches with history of Botox injections. Hypothyroidism. Laparoscopy for endometriosis. Does not take hormone replacement therapy. Has IUD in place. She's had a C-section through a Pfannenstiel incision.       Family history revealed mother was adopted but mother has no history of breast or ovarian cancer but doesn't get much screening. The mother has a sister, also no history cancer, also does not get screening. The patient has a brother who is well. Social history reveals she is married lives in [place] has twin children age 73 boy and a girl. Denies tobacco. Alcohol occasionally. She works with her husband at a company called [employer].          She does not want to take any chance  and requests that we proceed with right breast lumpectomy. Due to her young age I agree. She asked about genetic counseling and testing, I told her that she does not qualify at this point in time and I would hold off until we see what the final pathology is. She agrees with that.      She'll be scheduled for right breast lumpectomy with radioactive seed localization. I discussed the indications, details, techniques, and numerous risk of the surgery with her. She is aware the risk of bleeding, infection, cosmetic deformity, nerve damage with chronic pain, reoperation if this is cancer, and other unforeseen problems. She understands all these issues. All questions were answered. She agrees with this plan.   Other Problems  Anxiety Disorder  Back Pain  Depression  High blood pressure  Migraine Headache  Thyroid Disease   Past Surgical History  Breast Biopsy  Bilateral. Cesarean Section - 1  Foot Surgery  Bilateral. Oral Surgery   Diagnostic Studies History Colonoscopy  >10 years ago Mammogram  within last year Pap Smear  1-5 years ago  Allergies  SulfADIAZINE *SULFONAMIDES*   Medication History  OxyCODONE HCl (5MG  Tablet, Oral) Active. ALPRAZolam (1MG  Tablet, Oral) Active. Butalbital-APAP-Caffeine (50-325-40MG  Tablet, Oral) Active. Carvedilol (12.5MG  Tablet, Oral) Active. HydroCHLOROthiazide (25MG  Tablet, Oral) Active. Tinidazole (500MG  Tablet, Oral) Active. Levothyroxine Sodium ( Tablet, Oral) Active. Rizatriptan Benzoate (10MG  Tablet, Oral) Active. Promethazine HCl (25MG  Tablet, Oral) Active. Mirena (52 MG) (20MCG/24HR IUD, Intrauterine) Active. Medications Reconciled  Social History  Alcohol use  Occasional alcohol use. Caffeine use  Carbonated  beverages. No drug use  Tobacco use  Never smoker.  Family History Hypertension  Brother. Kidney Disease  Brother. Migraine Headache  Mother. Thyroid problems  Mother.  Pregnancy  / Birth History Age at menarche  10 years. Contraceptive History  Intrauterine device. Gravida  1 Irregular periods  Length (months) of breastfeeding  3-6 Maternal age  46-35 Para  2    Review of Systems General Not Present- Appetite Loss, Chills, Fatigue, Fever, Night Sweats, Weight Gain and Weight Loss. Skin Not Present- Change in Wart/Mole, Dryness, Hives, Jaundice, New Lesions, Non-Healing Wounds, Rash and Ulcer. HEENT Present- Wears glasses/contact lenses. Not Present- Earache, Hearing Loss, Hoarseness, Nose Bleed, Oral Ulcers, Ringing in the Ears, Seasonal Allergies, Sinus Pain, Sore Throat, Visual Disturbances and Yellow Eyes. Respiratory Not Present- Bloody sputum, Chronic Cough, Difficulty Breathing, Snoring and Wheezing. Breast Not Present- Breast Mass, Breast Pain, Nipple Discharge and Skin Changes. Cardiovascular Not Present- Chest Pain, Difficulty Breathing Lying Down, Leg Cramps, Palpitations, Rapid Heart Rate, Shortness of Breath and Swelling of Extremities. Gastrointestinal Not Present- Abdominal Pain, Bloating, Bloody Stool, Change in Bowel Habits, Chronic diarrhea, Constipation, Difficulty Swallowing, Excessive gas, Gets full quickly at meals, Hemorrhoids, Indigestion, Nausea, Rectal Pain and Vomiting. Female Genitourinary Not Present- Frequency, Nocturia, Painful Urination, Pelvic Pain and Urgency. Musculoskeletal Present- Back Pain. Not Present- Joint Pain, Joint Stiffness, Muscle Pain, Muscle Weakness and Swelling of Extremities. Neurological Present- Headaches. Not Present- Decreased Memory, Fainting, Numbness, Seizures, Tingling, Tremor, Trouble walking and Weakness. Psychiatric Present- Depression. Not Present- Anxiety, Bipolar, Change in Sleep Pattern, Fearful and Frequent crying. Endocrine Not Present- Cold Intolerance, Excessive Hunger, Hair Changes, Heat Intolerance, Hot flashes and New Diabetes. Hematology Not Present- Blood Thinners, Easy Bruising,  Excessive bleeding, Gland problems, HIV and Persistent Infections.  Vitals  Weight: 171 lb Height: 62in Body Surface Area: 1.79 m Body Mass Index: 31.28 kg/m  Pulse: 63 (Regular)  BP: 110/70 (Sitting, Left Arm, Standard)   Physical Exam General Mental Status-Alert. General Appearance-Consistent with stated age. Hydration-Well hydrated. Voice-Normal.  Head and Neck Head-normocephalic, atraumatic with no lesions or palpable masses. Trachea-midline. Thyroid Gland Characteristics - normal size and consistency.  Eye Eyeball - Bilateral-Extraocular movements intact. Sclera/Conjunctiva - Bilateral-No scleral icterus.  Chest and Lung Exam Chest and lung exam reveals -quiet, even and easy respiratory effort with no use of accessory muscles and on auscultation, normal breath sounds, no adventitious sounds and normal vocal resonance. Inspection Chest Wall - Normal. Back - normal.  Breast Note: Medium size. No palpable mass. Tiny biopsy sites laterally on each side. No significant hematoma. Nipple and areola complex is normal. No axillary adenopathy.   Cardiovascular Cardiovascular examination reveals -normal heart sounds, regular rate and rhythm with no murmurs and normal pedal pulses bilaterally.  Abdomen Inspection Inspection of the abdomen reveals - No Hernias. Skin - Scar - Note: Healed Pfannenstiel incision. Transverse infraumbilical laparoscopy scar. No masses or hernias. Palpation/Percussion Palpation and Percussion of the abdomen reveal - Soft, Non Tender, No Rebound tenderness, No Rigidity (guarding) and No hepatosplenomegaly. Auscultation Auscultation of the abdomen reveals - Bowel sounds normal.  Neurologic Neurologic evaluation reveals -alert and oriented x 3 with no impairment of recent or remote memory. Mental Status-Normal.  Musculoskeletal Normal Exam - Left-Upper Extremity Strength Normal and Lower Extremity Strength  Normal. Normal Exam - Right-Upper Extremity Strength Normal and Lower Extremity Strength Normal.  Lymphatic Head & Neck  General Head & Neck Lymphatics: Bilateral - Description - Normal. Axillary  General Axillary Region: Bilateral - Description - Normal.  Tenderness - Non Tender. Femoral & Inguinal  Generalized Femoral & Inguinal Lymphatics: Bilateral - Description - Normal. Tenderness - Non Tender.    Assessment & Plan  ATYPICAL LOBULAR HYPERPLASIA OF RIGHT BREAST (N60.91)  You have recently undergone imaging studies and bilateral needle biopsies of your breast The left sided biopsy shows completely benign fibrocystic changes The right-sided biopsy, in the upper outer quadrant of the right breast, shows atypical lobular hyperplasia On the right side, there is a small chance that there is lobular carcinoma in situ and a very small chance that you might have invasive lobular carcinoma.  We have decided to proceed with conservative right breast lumpectomy with radioactive seed localization We have discussed the indications, techniques, and risks of this surgery in detail  HYPERTENSION, BENIGN (I10) HYPOTHYROIDISM, ADULT (E03.9) HISTORY OF MIGRAINE HEADACHES (Z86.69) Impression: Has had Botox injection HISTORY OF C-SECTION (R60.454(Z98.891) IUD (INTRAUTERINE DEVICE) IN PLACE (Z97.5) BIPOLAR DISORDER IN FULL REMISSION (F31.70) HISTORY OF ENDOMETRIOSIS (Z87.42) Impression: Diagnosed on laparoscopy. Does not take hormonal replacement therapy    Khaliyah Northrop M. Derrell LollingIngram, M.D., [facility] Center[facility] General and Minimally invasive Surgery Breast and Colorectal Surgery Office:   [phone] Pager:   [phone]

## 2016-01-07 ENCOUNTER — Ambulatory Visit
Admission: RE | Admit: 2016-01-07 | Discharge: 2016-01-07 | Disposition: A | Discharge status: Home / Self Care | Payer: BLUE CROSS/BLUE SHIELD | Source: Ambulatory Visit | Attending: General Surgery | Admitting: General Surgery

## 2016-01-07 ENCOUNTER — Encounter (HOSPITAL_COMMUNITY): Payer: Self-pay | Admitting: *Deleted

## 2016-01-07 DIAGNOSIS — N6091 Unspecified benign mammary dysplasia of right breast: Secondary | ICD-10-CM

## 2016-01-07 NOTE — Progress Notes (Signed)
Spoke with pt for pre-op call. Pt denies cardiac history, chest pain or sob. Pt states she is not diabetic.  

## 2016-01-08 ENCOUNTER — Ambulatory Visit (HOSPITAL_COMMUNITY)
Admission: RE | Admit: 2016-01-08 | Discharge: 2016-01-08 | Disposition: A | Discharge status: Home / Self Care | Payer: BLUE CROSS/BLUE SHIELD | Source: Ambulatory Visit | Attending: General Surgery | Admitting: General Surgery

## 2016-01-08 ENCOUNTER — Ambulatory Visit (HOSPITAL_COMMUNITY): Payer: BLUE CROSS/BLUE SHIELD | Admitting: Anesthesiology

## 2016-01-08 ENCOUNTER — Encounter (HOSPITAL_COMMUNITY)
Admission: RE | Disposition: A | Discharge status: Home / Self Care | Payer: Self-pay | Source: Ambulatory Visit | Attending: General Surgery

## 2016-01-08 ENCOUNTER — Ambulatory Visit
Admission: RE | Admit: 2016-01-08 | Discharge: 2016-01-08 | Disposition: A | Discharge status: Home / Self Care | Payer: BLUE CROSS/BLUE SHIELD | Source: Ambulatory Visit | Attending: General Surgery | Admitting: General Surgery

## 2016-01-08 ENCOUNTER — Encounter (HOSPITAL_COMMUNITY): Payer: Self-pay | Admitting: *Deleted

## 2016-01-08 DIAGNOSIS — F3170 Bipolar disorder, currently in remission, most recent episode unspecified: Secondary | ICD-10-CM | POA: Diagnosis not present

## 2016-01-08 DIAGNOSIS — E039 Hypothyroidism, unspecified: Secondary | ICD-10-CM | POA: Diagnosis not present

## 2016-01-08 DIAGNOSIS — F419 Anxiety disorder, unspecified: Secondary | ICD-10-CM | POA: Insufficient documentation

## 2016-01-08 DIAGNOSIS — Z882 Allergy status to sulfonamides status: Secondary | ICD-10-CM | POA: Insufficient documentation

## 2016-01-08 DIAGNOSIS — J45909 Unspecified asthma, uncomplicated: Secondary | ICD-10-CM | POA: Diagnosis not present

## 2016-01-08 DIAGNOSIS — N6091 Unspecified benign mammary dysplasia of right breast: Secondary | ICD-10-CM

## 2016-01-08 DIAGNOSIS — G43909 Migraine, unspecified, not intractable, without status migrainosus: Secondary | ICD-10-CM | POA: Insufficient documentation

## 2016-01-08 DIAGNOSIS — Z975 Presence of (intrauterine) contraceptive device: Secondary | ICD-10-CM | POA: Insufficient documentation

## 2016-01-08 DIAGNOSIS — I10 Essential (primary) hypertension: Secondary | ICD-10-CM | POA: Insufficient documentation

## 2016-01-08 DIAGNOSIS — M1990 Unspecified osteoarthritis, unspecified site: Secondary | ICD-10-CM | POA: Diagnosis not present

## 2016-01-08 DIAGNOSIS — N62 Hypertrophy of breast: Secondary | ICD-10-CM | POA: Diagnosis present

## 2016-01-08 DIAGNOSIS — N6011 Diffuse cystic mastopathy of right breast: Secondary | ICD-10-CM | POA: Diagnosis not present

## 2016-01-08 HISTORY — DX: Unspecified benign mammary dysplasia of right breast: N60.91

## 2016-01-08 HISTORY — DX: Unspecified asthma, uncomplicated: J45.909

## 2016-01-08 HISTORY — DX: Hypothyroidism, unspecified: E03.9

## 2016-01-08 HISTORY — DX: Headache, unspecified: R51.9

## 2016-01-08 HISTORY — PX: BREAST LUMPECTOMY WITH RADIOACTIVE SEED LOCALIZATION: SHX6424

## 2016-01-08 HISTORY — DX: Bipolar disorder, unspecified: F31.9

## 2016-01-08 HISTORY — DX: Headache: R51

## 2016-01-08 HISTORY — DX: Essential (primary) hypertension: I10

## 2016-01-08 HISTORY — DX: Unspecified osteoarthritis, unspecified site: M19.90

## 2016-01-08 LAB — BASIC METABOLIC PANEL
Anion gap: 11 (ref 5–15)
BUN: 16 mg/dL (ref 6–20)
CO2: 25 mmol/L (ref 22–32)
Calcium: 9 mg/dL (ref 8.9–10.3)
Chloride: 101 mmol/L (ref 101–111)
Creatinine, Ser: 0.67 mg/dL (ref 0.44–1.00)
Glucose, Bld: 102 mg/dL — ABNORMAL HIGH (ref 65–99)
POTASSIUM: 3 mmol/L — AB (ref 3.5–5.1)
SODIUM: 137 mmol/L (ref 135–145)

## 2016-01-08 LAB — CBC
HEMATOCRIT: 40.2 % (ref 36.0–46.0)
Hemoglobin: 13.5 g/dL (ref 12.0–15.0)
MCH: 31.3 pg (ref 26.0–34.0)
MCHC: 33.6 g/dL (ref 30.0–36.0)
MCV: 93.1 fL (ref 78.0–100.0)
PLATELETS: 250 10*3/uL (ref 150–400)
RBC: 4.32 MIL/uL (ref 3.87–5.11)
RDW: 12.3 % (ref 11.5–15.5)
WBC: 6.1 10*3/uL (ref 4.0–10.5)

## 2016-01-08 LAB — HCG, SERUM, QUALITATIVE: Preg, Serum: NEGATIVE

## 2016-01-08 SURGERY — BREAST LUMPECTOMY WITH RADIOACTIVE SEED LOCALIZATION
Anesthesia: General | Site: Breast | Laterality: Right

## 2016-01-08 MED ORDER — HYDROMORPHONE HCL 1 MG/ML IJ SOLN
INTRAMUSCULAR | Status: AC
  Filled 2016-01-08: qty 0.5

## 2016-01-08 MED ORDER — ACETAMINOPHEN 650 MG RE SUPP: 650.0000 mg | RECTAL | Status: DC | PRN

## 2016-01-08 MED ORDER — ONDANSETRON HCL 4 MG/2ML IJ SOLN
INTRAMUSCULAR | Status: DC | PRN
Start: 2016-01-08 — End: 2016-01-08
  Administered 2016-01-08: 4 mg via INTRAVENOUS

## 2016-01-08 MED ORDER — CHLORHEXIDINE GLUCONATE CLOTH 2 % EX PADS: 6.0000 | MEDICATED_PAD | Freq: Once | CUTANEOUS | Status: DC

## 2016-01-08 MED ORDER — BUPIVACAINE-EPINEPHRINE (PF) 0.25% -1:200000 IJ SOLN
INTRAMUSCULAR | Status: AC
  Filled 2016-01-08: qty 30

## 2016-01-08 MED ORDER — FENTANYL CITRATE (PF) 100 MCG/2ML IJ SOLN: 25.0000 ug | INTRAMUSCULAR | Status: DC | PRN

## 2016-01-08 MED ORDER — OXYCODONE HCL 5 MG PO TABS
5.0000 mg | ORAL_TABLET | ORAL | Status: DC | PRN
  Administered 2016-01-08: 5 mg via ORAL

## 2016-01-08 MED ORDER — CEFAZOLIN SODIUM-DEXTROSE 2-4 GM/100ML-% IV SOLN
2.0000 g | INTRAVENOUS | Status: AC
  Administered 2016-01-08: 2 g via INTRAVENOUS
  Filled 2016-01-08: qty 100

## 2016-01-08 MED ORDER — ONDANSETRON HCL 4 MG/2ML IJ SOLN
INTRAMUSCULAR | Status: AC
  Filled 2016-01-08: qty 2

## 2016-01-08 MED ORDER — GABAPENTIN 300 MG PO CAPS
300.0000 mg | ORAL_CAPSULE | ORAL | Status: AC
  Administered 2016-01-08: 300 mg via ORAL
  Filled 2016-01-08: qty 1

## 2016-01-08 MED ORDER — LACTATED RINGERS IV SOLN
INTRAVENOUS | Status: DC | PRN
  Administered 2016-01-08: 10:00:00 via INTRAVENOUS

## 2016-01-08 MED ORDER — PROPOFOL 10 MG/ML IV BOLUS
INTRAVENOUS | Status: AC
  Filled 2016-01-08: qty 20

## 2016-01-08 MED ORDER — PROPOFOL 10 MG/ML IV BOLUS
INTRAVENOUS | Status: DC | PRN
  Administered 2016-01-08: 200 mg via INTRAVENOUS

## 2016-01-08 MED ORDER — BUPIVACAINE-EPINEPHRINE 0.25% -1:200000 IJ SOLN
INTRAMUSCULAR | Status: DC | PRN
  Administered 2016-01-08: 10 mL

## 2016-01-08 MED ORDER — 0.9 % SODIUM CHLORIDE (POUR BTL) OPTIME
TOPICAL | Status: DC | PRN
  Administered 2016-01-08: 1000 mL

## 2016-01-08 MED ORDER — DEXAMETHASONE SODIUM PHOSPHATE 10 MG/ML IJ SOLN
INTRAMUSCULAR | Status: AC
  Filled 2016-01-08: qty 1

## 2016-01-08 MED ORDER — LACTATED RINGERS IV SOLN: INTRAVENOUS | Status: DC

## 2016-01-08 MED ORDER — DEXAMETHASONE SODIUM PHOSPHATE 10 MG/ML IJ SOLN
INTRAMUSCULAR | Status: DC | PRN
  Administered 2016-01-08: 10 mg via INTRAVENOUS

## 2016-01-08 MED ORDER — ACETAMINOPHEN 325 MG PO TABS: 650.0000 mg | ORAL_TABLET | ORAL | Status: DC | PRN

## 2016-01-08 MED ORDER — HYDROMORPHONE HCL 1 MG/ML IJ SOLN
0.2500 mg | INTRAMUSCULAR | Status: DC | PRN
  Administered 2016-01-08 (×3): 0.5 mg via INTRAVENOUS

## 2016-01-08 MED ORDER — LIDOCAINE 2% (20 MG/ML) 5 ML SYRINGE
INTRAMUSCULAR | Status: AC
  Filled 2016-01-08: qty 5

## 2016-01-08 MED ORDER — CELECOXIB 200 MG PO CAPS
400.0000 mg | ORAL_CAPSULE | ORAL | Status: AC
  Administered 2016-01-08: 400 mg via ORAL
  Filled 2016-01-08: qty 2

## 2016-01-08 MED ORDER — FENTANYL CITRATE (PF) 100 MCG/2ML IJ SOLN
INTRAMUSCULAR | Status: DC | PRN
  Administered 2016-01-08 (×2): 50 ug via INTRAVENOUS

## 2016-01-08 MED ORDER — OXYCODONE HCL 5 MG PO TABS
ORAL_TABLET | ORAL | Status: AC
  Filled 2016-01-08: qty 1

## 2016-01-08 MED ORDER — HYDROCODONE-ACETAMINOPHEN 5-325 MG PO TABS: 1.0000 | ORAL_TABLET | Freq: Four times a day (QID) | ORAL | 0 refills | Status: DC | PRN

## 2016-01-08 MED ORDER — FENTANYL CITRATE (PF) 100 MCG/2ML IJ SOLN
INTRAMUSCULAR | Status: AC
  Filled 2016-01-08: qty 2

## 2016-01-08 MED ORDER — LIDOCAINE 2% (20 MG/ML) 5 ML SYRINGE
INTRAMUSCULAR | Status: DC | PRN
  Administered 2016-01-08: 100 mg via INTRAVENOUS

## 2016-01-08 MED ORDER — ACETAMINOPHEN 500 MG PO TABS
1000.0000 mg | ORAL_TABLET | ORAL | Status: AC
  Administered 2016-01-08: 1000 mg via ORAL
  Filled 2016-01-08: qty 2

## 2016-01-08 MED ORDER — MIDAZOLAM HCL 2 MG/2ML IJ SOLN
INTRAMUSCULAR | Status: AC
  Filled 2016-01-08: qty 2

## 2016-01-08 MED ORDER — LACTATED RINGERS IV SOLN
Freq: Once | INTRAVENOUS | Status: AC
  Administered 2016-01-08: 10:00:00 via INTRAVENOUS

## 2016-01-08 MED ORDER — MIDAZOLAM HCL 5 MG/5ML IJ SOLN
INTRAMUSCULAR | Status: DC | PRN
  Administered 2016-01-08: 2 mg via INTRAVENOUS

## 2016-01-08 SURGICAL SUPPLY — 45 items
APPLIER CLIP 9.375 MED OPEN (MISCELLANEOUS) ×3
BINDER BREAST LRG (GAUZE/BANDAGES/DRESSINGS) ×3 IMPLANT
BINDER BREAST XLRG (GAUZE/BANDAGES/DRESSINGS) IMPLANT
BLADE SURG 15 STRL LF DISP TIS (BLADE) ×1 IMPLANT
BLADE SURG 15 STRL SS (BLADE) ×2
CANISTER SUCTION 2500CC (MISCELLANEOUS) ×3 IMPLANT
CHLORAPREP W/TINT 26ML (MISCELLANEOUS) ×3 IMPLANT
CLIP APPLIE 9.375 MED OPEN (MISCELLANEOUS) ×1 IMPLANT
COVER PROBE W GEL 5X96 (DRAPES) ×3 IMPLANT
COVER SURGICAL LIGHT HANDLE (MISCELLANEOUS) ×3 IMPLANT
DERMABOND ADVANCED (GAUZE/BANDAGES/DRESSINGS) ×2
DERMABOND ADVANCED .7 DNX12 (GAUZE/BANDAGES/DRESSINGS) ×1 IMPLANT
DEVICE DUBIN SPECIMEN MAMMOGRA (MISCELLANEOUS) ×3 IMPLANT
DRAPE CHEST BREAST 15X10 FENES (DRAPES) ×3 IMPLANT
DRAPE UTILITY XL STRL (DRAPES) ×3 IMPLANT
DRSG PAD ABDOMINAL 8X10 ST (GAUZE/BANDAGES/DRESSINGS) ×3 IMPLANT
ELECT CAUTERY BLADE 6.4 (BLADE) ×3 IMPLANT
ELECT REM PT RETURN 9FT ADLT (ELECTROSURGICAL) ×3
ELECTRODE REM PT RTRN 9FT ADLT (ELECTROSURGICAL) ×1 IMPLANT
GLOVE EUDERMIC 7 POWDERFREE (GLOVE) ×3 IMPLANT
GOWN STRL REUS W/ TWL LRG LVL3 (GOWN DISPOSABLE) ×1 IMPLANT
GOWN STRL REUS W/ TWL XL LVL3 (GOWN DISPOSABLE) ×1 IMPLANT
GOWN STRL REUS W/TWL LRG LVL3 (GOWN DISPOSABLE) ×2
GOWN STRL REUS W/TWL XL LVL3 (GOWN DISPOSABLE) ×2
ILLUMINATOR WAVEGUIDE N/F (MISCELLANEOUS) IMPLANT
KIT BASIN OR (CUSTOM PROCEDURE TRAY) ×3 IMPLANT
KIT MARKER MARGIN INK (KITS) ×3 IMPLANT
LIGHT WAVEGUIDE WIDE FLAT (MISCELLANEOUS) IMPLANT
NEEDLE HYPO 25X1 1.5 SAFETY (NEEDLE) ×3 IMPLANT
NS IRRIG 1000ML POUR BTL (IV SOLUTION) IMPLANT
PACK SURGICAL SETUP 50X90 (CUSTOM PROCEDURE TRAY) ×3 IMPLANT
PENCIL BUTTON HOLSTER BLD 10FT (ELECTRODE) ×3 IMPLANT
SPONGE GAUZE 4X4 12PLY STER LF (GAUZE/BANDAGES/DRESSINGS) ×3 IMPLANT
SPONGE LAP 18X18 X RAY DECT (DISPOSABLE) ×3 IMPLANT
SPONGE LAP 4X18 X RAY DECT (DISPOSABLE) ×3 IMPLANT
SUT MNCRL AB 4-0 PS2 18 (SUTURE) ×3 IMPLANT
SUT SILK 2 0 SH (SUTURE) ×3 IMPLANT
SUT VIC AB 3-0 SH 18 (SUTURE) ×3 IMPLANT
SYR BULB 3OZ (MISCELLANEOUS) ×3 IMPLANT
SYR CONTROL 10ML LL (SYRINGE) ×3 IMPLANT
TOWEL OR 17X24 6PK STRL BLUE (TOWEL DISPOSABLE) ×3 IMPLANT
TOWEL OR 17X26 10 PK STRL BLUE (TOWEL DISPOSABLE) ×3 IMPLANT
TUBE CONNECTING 12'X1/4 (SUCTIONS) ×1
TUBE CONNECTING 12X1/4 (SUCTIONS) ×2 IMPLANT
YANKAUER SUCT BULB TIP NO VENT (SUCTIONS) ×3 IMPLANT

## 2016-01-08 NOTE — Interval H&P Note (Signed)
History and Physical Interval Note:  01/08/2016 10:49 AM  Jessica DancerAndrea C Crean  has presented today for surgery, with the diagnosis of RIGHT BREAST ATYPICAL LOBULAR HYPERPLASIA  The various methods of treatment have been discussed with the patient and family. After consideration of risks, benefits and other options for treatment, the patient has consented to  Procedure(s): RIGHT BREAST LUMPECTOMY WITH RADIOACTIVE SEED LOCALIZATION (Right) as a surgical intervention .  The patient's history has been reviewed, patient examined, no change in status, stable for surgery.  I have reviewed the patient's chart and labs.  Questions were answered to the patient's satisfaction.     Ernestene MentionINGRAM,Violet Cart M

## 2016-01-08 NOTE — Discharge Instructions (Signed)
[facility] °Office Phone Number [phone] ° °BREAST BIOPSY/ PARTIAL MASTECTOMY: POST OP INSTRUCTIONS ° °Always review your discharge instruction sheet given to you by the facility where your surgery was performed. ° °IF YOU HAVE DISABILITY OR FAMILY LEAVE FORMS, YOU MUST BRING THEM TO THE OFFICE FOR PROCESSING.  DO NOT GIVE THEM TO YOUR DOCTOR. ° °1. A prescription for pain medication may be given to you upon discharge.  Take your pain medication as prescribed, if needed.  If narcotic pain medicine is not needed, then you may take acetaminophen (Tylenol) or ibuprofen (Advil) as needed. °2. Take your usually prescribed medications unless otherwise directed °3. If you need a refill on your pain medication, please contact your pharmacy.  They will contact our office to request authorization.  Prescriptions will not be filled after 5pm or on week-ends. °4. You should eat very light the first 24 hours after surgery, such as soup, crackers, pudding, etc.  Resume your normal diet the day after surgery. °5. Most patients will experience some swelling and bruising in the breast.  Ice packs and a good support bra will help.  Swelling and bruising can take several days to resolve.  °6. It is common to experience some constipation if taking pain medication after surgery.  Increasing fluid intake and taking a stool softener will usually help or prevent this problem from occurring.  A mild laxative (Milk of Magnesia or Miralax) should be taken according to package directions if there are no bowel movements after 48 hours. °7. Unless discharge instructions indicate otherwise, you may remove your bandages 24-48 hours after surgery, and you may shower at that time.  You may have steri-strips (small skin tapes) in place directly over the incision.  These strips should be left on the skin for 7-10 days.  If your surgeon used skin glue on the incision, you may shower in 24 hours.  The glue will flake off over the  next 2-3 weeks.  Any sutures or staples will be removed at the office during your follow-up visit. °8. ACTIVITIES:  You may resume regular daily activities (gradually increasing) beginning the next day.  Wearing a good support bra or sports bra minimizes pain and swelling.  You may have sexual intercourse when it is comfortable. °a. You may drive when you no longer are taking prescription pain medication, you can comfortably wear a seatbelt, and you can safely maneuver your car and apply brakes. °b. RETURN TO WORK:  ______________________________________________________________________________________ °9. You should see your doctor in the office for a follow-up appointment approximately two weeks after your surgery.  Your doctor’s nurse will typically make your follow-up appointment when she calls you with your pathology report.  Expect your pathology report 2-3 business days after your surgery.  You may call to check if you do not hear from us after three days. °10. OTHER INSTRUCTIONS: _______________________________________________________________________________________________ _____________________________________________________________________________________________________________________________________ °_____________________________________________________________________________________________________________________________________ °_____________________________________________________________________________________________________________________________________ ° °WHEN TO CALL YOUR DOCTOR: °1. Fever over 101.0 °2. Nausea and/or vomiting. °3. Extreme swelling or bruising. °4. Continued bleeding from incision. °5. Increased pain, redness, or drainage from the incision. ° °The clinic staff is available to answer your questions during regular business hours.  Please don’t hesitate to call and ask to speak to one of the nurses for clinical concerns.  If you have a medical emergency, go to the nearest  emergency room or call 911.  A surgeon from [facility] is always on call at the hospital. ° °For further questions, please visit [email or web address]  °

## 2016-01-08 NOTE — Op Note (Signed)
Patient Name:           Jessica Navarro   Date of Surgery:        01/08/2016  Pre op Diagnosis:      Atypical lobular hyperplasia right breast  Procedure:                 Right breast lumpectomy with radioactive seed localization  Surgeon:                     Edsel Petrin. Dalbert Batman, M.D., FACS  Assistant:                      OR staff   Indication for Assistant: N/A  Operative Indications:   The patient is a 45 year old female who presents with a complaint of Breast problems.  She is  from [place]. Referred by Dr. Ammie Ferrier at the [facility] for atypical lobular hyperplasia of the right breast, upper outer quadrant. Dr. Gilford Rile is her PCP. Melissa Blanch Media Brown-Patram,NP provides most of her routine primary care.      The patient has not had any serious breast problems in the past. She's had a couple of callbacks but no biopsies. Recent imaging studies an ultrasound showed category C density. In the upper outer right breast is a small area of calcifications. Largest area 5 mm and the posterior third. There was also some calcifications in distortion on the left side. Biopsy of the left side showed benign fibrocystic change and calcifications. This was felt to be concordant. On the right side, however she had atypical lobular hyperplasia and she was referred for surgical evaluation.     Comorbidities include hypertension. Depression and bipolar disorder well-controlled. Migraine headaches with history of Botox injections. Hypothyroidism. Laparoscopy for endometriosis. Does not take hormone replacement therapy. Has IUD in place. She's had a C-section through a Pfannenstiel incision.       Family history revealed mother was adopted but mother has no history of breast or ovarian cancer but doesn't get much screening. The mother has a sister, also no history cancer, also does not get screening. The patient has a brother who is well.          She does not want to take any chance and  requests that we proceed with right breast lumpectomy. Due to her young age I agree. She asked about genetic counseling and testing, I told her that she does not qualify at this point in time and I would hold off until we see what the final pathology is. She agrees with that.      She'll be scheduled for right breast lumpectomy with radioactive seed localization. I discussed the indications, details, techniques, and numerous risk of the surgery with her. She is aware the risk of bleeding, infection, cosmetic deformity, nerve damage with chronic pain, reoperation if this is cancer, and other unforeseen problems. She understands all these issues. All questions were answered. She agrees with this plan.  Operative Findings:       I was able to identify the radioactive signal with the neoprobe while the patient was in the preop holding area.  In the operating room the radioactive seed was located in the posterior third of the right breast, laterally at about the 9:30 position.  This allowed a very laterally placed incision and a hidden scar technique.  The specimen mammogram looked good showing the seed and the marker clip.  There was no gross  palpable abnormality.  Procedure in Detail:          Following the induction of general LMA anesthesia the patient's right breast was prepped and draped in a sterile fashion.  Surgical timeout was performed.  Intravenous antibiotics were given.  0.5% Marcaine was used as a local infiltration anesthetic.      Using the neoprobe I identified the radioactive signal lateral and posterior in the right breast.  I made a curvilinear incision in the very far lateral right breast almost to the lateral boundary of the breast.  Dissection was carried down into the breast tissue and using the neoprobe frequently,  dissected around the radioactive signal and removed the specimen.  The specimen was marked with silk sutures and a 6 color ink kit  the specimen mammogram looked good  as discussed above.     Hemostasis was adequate and was achieved with electrocautery.  The wound was irrigated with saline.  The lumpectomy cavity was marked with 5 metal clips in the cardinal positions.  The deeper breast tissues were closed with 3-0 Vicryl sutures and the skin closed with running subcuticular 4-0 Monocryl and Dermabond.  Clean bandages and a breast binder were placed.  The patient tolerated the procedure well  and  was taken to PACU in stable condition.  EBL 10 mL.  Counts correct.  Complications none.     Edsel Petrin. Dalbert Batman, M.D., FACS General and Minimally Invasive Surgery Breast and Colorectal Surgery  01/08/2016 12:15 PM

## 2016-01-08 NOTE — Anesthesia Procedure Notes (Signed)
Procedure Name: LMA Insertion Date/Time: 01/08/2016 11:27 AM Performed by: Sharlene DoryWALKER, Jessica Menon E Pre-anesthesia Checklist: Patient identified, Emergency Drugs available, Suction available and Patient being monitored Patient Re-evaluated:Patient Re-evaluated prior to inductionOxygen Delivery Method: Circle system utilized Preoxygenation: Pre-oxygenation with 100% oxygen Intubation Type: IV induction LMA: LMA inserted LMA Size: 4.0 Number of attempts: 1 Placement Confirmation: positive ETCO2 and breath sounds checked- equal and bilateral Tube secured with: Tape Dental Injury: Teeth and Oropharynx as per pre-operative assessment

## 2016-01-08 NOTE — Anesthesia Preprocedure Evaluation (Signed)
Anesthesia Evaluation  Patient identified by MRN, date of birth, ID band Patient awake    Reviewed: Allergy & Precautions, NPO status , Patient's Chart, lab work & pertinent test results  Airway Mallampati: II  TM Distance: >3 FB     Dental   Pulmonary asthma ,    breath sounds clear to auscultation       Cardiovascular hypertension,  Rhythm:Regular Rate:Normal     Neuro/Psych  Headaches,    GI/Hepatic negative GI ROS, Neg liver ROS,   Endo/Other  Hypothyroidism   Renal/GU negative Renal ROS     Musculoskeletal  (+) Arthritis ,   Abdominal   Peds  Hematology   Anesthesia Other Findings   Reproductive/Obstetrics                             Anesthesia Physical Anesthesia Plan  ASA: III  Anesthesia Plan:    Post-op Pain Management:    Induction: Intravenous  Airway Management Planned: LMA  Additional Equipment:   Intra-op Plan:   Post-operative Plan: Extubation in OR  Informed Consent: I have reviewed the patients History and Physical, chart, labs and discussed the procedure including the risks, benefits and alternatives for the proposed anesthesia with the patient or authorized representative who has indicated his/her understanding and acceptance.   Dental advisory given  Plan Discussed with: CRNA and Anesthesiologist  Anesthesia Plan Comments:         Anesthesia Quick Evaluation

## 2016-01-08 NOTE — Transfer of Care (Signed)
Immediate Anesthesia Transfer of Care Note  Patient: Jessica Navarro  Procedure(s) Performed: Procedure(s): RIGHT BREAST LUMPECTOMY WITH RADIOACTIVE SEED LOCALIZATION (Right)  Patient Location: PACU  Anesthesia Type:General  Level of Consciousness: awake, alert  and oriented  Airway & Oxygen Therapy: Patient Spontanous Breathing and Patient connected to nasal cannula oxygen  Post-op Assessment: Report given to RN, Post -op Vital signs reviewed and stable and Patient moving all extremities X 4  Post vital signs: Reviewed and stable  Last Vitals:  Vitals:   01/08/16 0909  BP: 107/70  Pulse: 72  Resp: 18  Temp: 36.8 C    Last Pain:  Vitals:   01/08/16 0909  TempSrc: Oral      Patients Stated Pain Goal: 3 (01/08/16 0941)  Complications: No apparent anesthesia complications

## 2016-01-08 NOTE — Anesthesia Postprocedure Evaluation (Signed)
Anesthesia Post Note  Patient: Jessica Navarro  Procedure(s) Performed: Procedure(s) (LRB): RIGHT BREAST LUMPECTOMY WITH RADIOACTIVE SEED LOCALIZATION (Right)  Patient location during evaluation: PACU Anesthesia Type: General Level of consciousness: awake Pain management: pain level controlled Vital Signs Assessment: post-procedure vital signs reviewed and stable Respiratory status: spontaneous breathing Cardiovascular status: stable Anesthetic complications: no    Last Vitals:  Vitals:   01/08/16 1329 01/08/16 1345  BP:  111/69  Pulse: (!) 58 61  Resp: 11 15  Temp:  36.7 C    Last Pain:  Vitals:   01/08/16 1345  TempSrc:   PainSc: 3                  Tameah Mihalko

## 2016-01-09 ENCOUNTER — Encounter (HOSPITAL_COMMUNITY): Payer: Self-pay | Admitting: General Surgery

## 2016-02-04 ENCOUNTER — Telehealth: Payer: Self-pay | Admitting: Hematology

## 2016-02-04 ENCOUNTER — Encounter: Payer: Self-pay | Admitting: Hematology

## 2016-02-04 NOTE — Telephone Encounter (Signed)
Apt scheduled w/Feng on 1/30 at 11am. Pt aware to arrive 30 minutes early. Demographics verified. Letter mailed.

## 2016-02-21 NOTE — Progress Notes (Signed)
[facility]  [phone] [phone]  Clinic New Consult Note   Patient Care Team: Gordan Payment, MD as PCP - General (Internal Medicine) 02/26/2016  Referring physician: Dr. Derrell Lolling  CHIEF COMPLAINTS/PURPOSE OF CONSULTATION:  atypical lobular hyperplasia of the right breast  HISTORY OF PRESENTING ILLNESS (02/26/2016):  Jessica Navarro 46 y.o. female is here because of atypical lobular hyperplasia of the right breast. Pt found an area of calcifications in the upper outer right breast. A biopsy was done and showed atypical lobular hyperplasia. On Jan 08, 2016 a R breast lumpectomy with radioactive seed localization was performed by Dr. Derrell Lolling. She presents for continued care.   She gets mammograms every year. This was the first mammogram that was abnormal. She did not feel any lumps and felt fine. She did well with her surgery in December. She has HTN, hyperthyroidism, migraines, and depression. She takes medications for these conditions. She uses a Mirena IUD for contraception and doesn't get any periods. Her period was regular until she started using the IUD. She was on birth control pills as a teenager. Her first period was when she was 89.   Denies any concerns. She feels good.   There is no family history of breast cancer that she knows of. However, her mother was adopted. Never smoker. Occasional alcohol drinker - once or twice a week. She is married and has twins. She works as an Print production planner.   GYN HISTORY  Menarchal: 10 LMP: a few years ago before IUD  Contraceptive: Mirena IUD HRT: No G1P1: One pregnancy, twins.   MEDICAL HISTORY:  Past Medical History:  Diagnosis Date  . Abnormal pap   . Allergy   . Anxiety   . Arthritis    back  . Asthma    as a child  . Atypical lobular hyperplasia [facility]) of right breast 01/08/2016  . Bipolar disorder (HCC)   . Endometriosis   . Headache    migraines  . HSV-2 (herpes simplex virus 2)  infection   . Hypertension   . Hypothyroidism   . Infertility, female   . Thyroid disease    hypothyroidism    SURGICAL HISTORY: Past Surgical History:  Procedure Laterality Date  . BREAST LUMPECTOMY WITH RADIOACTIVE SEED LOCALIZATION Right 01/08/2016   Procedure: RIGHT BREAST LUMPECTOMY WITH RADIOACTIVE SEED LOCALIZATION;  Surgeon: Claud Kelp, MD;  Location: [facility] OR;  Service: General;  Laterality: Right;  . CESAREAN SECTION  2007  . COLONOSCOPY    . foot surgerey    . SINUSOTOMY      SOCIAL HISTORY: Social History   Social History  . Marital status: Married    Spouse name: N/A  . Number of children: N/A  . Years of education: N/A   Occupational History  . Not on file.   Social History Main Topics  . Smoking status: Never Smoker  . Smokeless tobacco: Never Used  . Alcohol use 0.0 oz/week    1 - 2 Standard drinks or equivalent per week     Comment: occasional, 1-2 times a week, cocktail   . Drug use: No  . Sexual activity: Yes    Birth control/ protection: IUD   Other Topics Concern  . Not on file   Social History Narrative  . No narrative on file    FAMILY HISTORY: Family History  Problem Relation Age of Onset  . Fibromyalgia Mother   . Alzheimer's disease Paternal Grandmother   . Stomach cancer Paternal Grandfather  70    ALLERGIES:  is allergic to sulfa antibiotics.  MEDICATIONS:  Current Outpatient Prescriptions  Medication Sig Dispense Refill  . ALPRAZolam (XANAX) 1 MG tablet Take 1 mg by mouth daily as needed (for anxiety).     . Aspirin-Salicylamide-Caffeine (BC HEADACHE POWDER PO) Take 1 packet by mouth daily as needed (for mild headaches.).    Marland Kitchen. butalbital-acetaminophen-caffeine (FIORICET, ESGIC) 50-325-40 MG per tablet Take 1-2 tablets by mouth every 6 (six) hours as needed (for migraine headaches.).     Marland Kitchen. carvedilol (COREG) 12.5 MG tablet Take 12.5 mg by mouth 2 (two) times daily.  4  . hydrochlorothiazide (HYDRODIURIL) 25 MG tablet Take 25  mg by mouth daily.  4  . levonorgestrel (MIRENA) 20 MCG/24HR IUD 1 each by Intrauterine route once.    Marland Kitchen. levothyroxine (SYNTHROID, LEVOTHROID) 50 MCG tablet Take 50 mcg by mouth daily before breakfast.     . OnabotulinumtoxinA (BOTOX IJ) Inject as directed. Gets botox injections every 3 months for migraines    . oxyCODONE (OXY IR/ROXICODONE) 5 MG immediate release tablet Take 5 mg by mouth daily as needed. For pain  0  . promethazine (PHENERGAN) 25 MG tablet Take 25 mg by mouth every 4 (four) hours as needed (for migraine induced nausea).     . rizatriptan (MAXALT) 10 MG tablet Take 10 mg by mouth daily as needed (for migraine headaches.).     Marland Kitchen. valACYclovir (VALTREX) 500 MG tablet Take 500 mg by mouth daily as needed. For cold sores.  0  . vortioxetine HBr (TRINTELLIX) 10 MG TABS Take 10 mg by mouth at bedtime.    Marland Kitchen. HYDROcodone-acetaminophen (NORCO) 5-325 MG tablet Take 1-2 tablets by mouth every 6 (six) hours as needed. (Patient not taking: Reported on 02/26/2016) 30 tablet 0   No current facility-administered medications for this visit.     REVIEW OF SYSTEMS:   Constitutional: Denies fevers, chills or abnormal night sweats Eyes: Denies blurriness of vision, double vision or watery eyes Ears, nose, mouth, throat, and face: Denies mucositis or sore throat Respiratory: Denies cough, dyspnea or wheezes Cardiovascular: Denies palpitation, chest discomfort or lower extremity swelling Gastrointestinal:  Denies nausea, heartburn or change in bowel habits Skin: Denies abnormal skin rashes Lymphatics: Denies new lymphadenopathy or easy bruising Neurological:Denies numbness, tingling or new weaknesses Behavioral/Psych: Mood is stable, no new changes  All other systems were reviewed with the patient and are negative.  PHYSICAL EXAMINATION: ECOG PERFORMANCE STATUS: 0 - Asymptomatic  Vitals:   02/26/16 1053  BP: 125/80  Pulse: 81  Resp: 17  Temp: 98.5 F (36.9 C)   Filed Weights   02/26/16  1053  Weight: 172 lb 8 oz (78.2 kg)   GENERAL:alert, no distress and comfortable SKIN: skin color, texture, turgor are normal, no rashes or significant lesions EYES: normal, conjunctiva are pink and non-injected, sclera clear OROPHARYNX:no exudate, no erythema and lips, buccal mucosa, and tongue normal  NECK: supple, thyroid normal size, non-tender, without nodularity LYMPH:  no palpable lymphadenopathy in the cervical, axillary or inguinal LUNGS: clear to auscultation and percussion with normal breathing effort HEART: regular rate & rhythm and no murmurs and no lower extremity edema ABDOMEN:abdomen soft, non-tender and normal bowel sounds Musculoskeletal:no cyanosis of digits and no clubbing  BREAST: R breast healed well. No palpable masses. L breast unremarkable.   PSYCH: alert & oriented x 3 with fluent speech NEURO: no focal motor/sensory deficits Breasts: Breast inspection showed them to be symmetrical with no nipple discharge. Surgical scar  in the right breast has healed very well. Palpation of the breasts and axilla revealed no obvious mass that I could appreciate.   LABORATORY DATA:  I have reviewed the data as listed CBC Latest Ref Rng & Units 01/08/2016 10/15/2006  WBC 4.0 - 10.5 K/uL 6.1 -  Hemoglobin 12.0 - 15.0 g/dL 78.2 95.6  Hematocrit 21.3 - 46.0 % 40.2 -  Platelets 150 - 400 K/uL 250 -    CMP Latest Ref Rng & Units 02/26/2016 01/08/2016  Glucose 70 - 140 mg/dl 91 086(V)  BUN 7.0 - 78.4 mg/dL 69.6 16  Creatinine 0.6 - 1.1 mg/dL 0.7 2.95  Sodium 284 - 145 mEq/L 138 137  Potassium 3.5 - 5.1 mEq/L 3.4(L) 3.0(L)  Chloride 101 - 111 mmol/L - 101  CO2 22 - 29 mEq/L 28 25  Calcium 8.4 - 10.4 mg/dL 9.4 9.0  Total Protein 6.4 - 8.3 g/dL 7.4 -  Total Bilirubin 0.20 - 1.20 mg/dL 1.32 -  Alkaline Phos 40 - 150 U/L 85 -  AST 5 - 34 U/L 20 -  ALT 0 - 55 U/L 32 -   PATHOLOGY:  Diagnosis 01/08/2016 Breast, lumpectomy, Right - BREAST PARENCHYMA SHOWING FIBROCYSTIC  CHANGES. - BIOPSY SITE WITH BIOPSY SITE CHANGES PRESENT (BIOPSY CLIP IDENTIFIED). - NO ATYPIA, HYPERPLASIA, OR MALIGNANCY IDENTIFIED. - SEE COMMENT. Microscopic Comment The biopsy clip is identified and the biopsy clip site is submitted along with tissue around the biopsy clip site without atypia, hyperplasia or malignancy identified. The margins have been previously inked and are histologically examined. (RH:kh 01-10-16)  Diagnosis 12/12/2015 1. Breast, left, needle core biopsy, upper outer quadrant FIBROCYSTIC CHANGES WITH CALCIFICATIONS 2. Breast, right, needle core biopsy, upper outer quadrant ATYPICAL LOBULAR HYPERPLASIA FIBROCYSTIC CHANGES WITH CALCIFICATIONS  RADIOGRAPHIC STUDIES: I have personally reviewed the radiological images as listed and agreed with the findings in the report.  MM Bilateral 12/10/2015 IMPRESSION: Similar-appearing probably benign calcifications bilaterally, favored to be milk of calcium related to fibrocystic change.  ASSESSMENT & PLAN:   46 y.o. Caucasian premenopausal female with  1. left breast atypical lobular hyperplasia -I discussed the imaging findings and surgical pathology results with her in great details. -We reviewed the natural history of atypical hyperplasia. It is consider a benign breast disease, however it does increase the risk of breast cancer by 3-5 fold. It is considered as a high risk for breast cancer. -We used that gAIL model to calculate her risk of breast cancer, which is 30% by age of 65. -I highly recommend her to continue annual screening mammogram, which will detect early stage breast cancer. She agrees to continue. She is very compliant on screening -We also discussed healthy diet and regular exercise, calcium and vitamin D supplement, to reduce her risk of breast cancer -She is currently on [address], and estrogen-containing IUD, which can potentially increase the risk of breast cancer. I recommend her to remove it. She is  agreeable, and will see hre GYN. -We further discussed chemoprevention to breast cancer. I discussed the option of tamoxifen, raloxifene and anastrozole. These endocrine therapy agent is likely going to reduce the risk of breast cancer by 30-40%, however there is no data of survival benefit so far. She is premenopausal, tamoxifen is her only option for now. ---The potential side effects, which includes but not limited to, hot flash, skin and vaginal dryness, slightly increased risk of cardiovascular disease and cataract, small risk of thrombosis and endometrial cancer, were discussed with her in great details. Preventive strategies  for thrombosis, such as being physically active, using compression stocks, avoid cigarette smoking, etc., were reviewed with her. I also recommend her to follow-up with her gynecologist once a year, and watch for vaginal spotting or bleeding, as a clinically sign of endometrial cancer, etc. I given her the written material of tamoxifen, and also encouraged her to discuss with her gynecologist. She voiced good understanding, and we'll call me as soon as she makes her decision.  2. Bone health -She has not had a DEXA scan. She is premenopausal. -We discussed how tamoxifen can help her bone health.    Plan -Labs to check CMP, Her CBC was normal before breast surgery   -She will let me know if she would like to start tamoxifen.  -If she takes Tamoxifen, I will schedule her to return in 2-3 months to see how she is doing on the tamoxifen.    Orders Placed This Encounter  Procedures  . Comprehensive metabolic panel    Standing Status:   Future    Number of Occurrences:   1    Standing Expiration Date:   02/25/2017    All questions were answered. The patient knows to call the clinic with any problems, questions or concerns.  I spent 50 minutes counseling the patient face to face. The total time spent in the appointment was 60 minutes and more than 50% was on  counseling.   This document serves as a record of services personally performed by Malachy Mood, MD. It was created on her behalf by [place] Casey, a trained medical scribe. The creation of this record is based on the scribe's personal observations and the provider's statements to them. This document has been checked and approved by the attending provider.  I have reviewed the above documentation for accuracy and completeness and I agree with the above.   Malachy Mood, MD 02/26/2016

## 2016-02-26 ENCOUNTER — Ambulatory Visit (HOSPITAL_BASED_OUTPATIENT_CLINIC_OR_DEPARTMENT_OTHER): Payer: BLUE CROSS/BLUE SHIELD | Admitting: Hematology

## 2016-02-26 ENCOUNTER — Telehealth: Payer: Self-pay | Admitting: Hematology

## 2016-02-26 ENCOUNTER — Ambulatory Visit (HOSPITAL_BASED_OUTPATIENT_CLINIC_OR_DEPARTMENT_OTHER): Payer: BLUE CROSS/BLUE SHIELD

## 2016-02-26 ENCOUNTER — Encounter: Payer: Self-pay | Admitting: Hematology

## 2016-02-26 VITALS — BP 125/80 | HR 81 | Temp 98.5°F | Resp 17 | Ht 62.0 in | Wt 172.5 lb

## 2016-02-26 DIAGNOSIS — N6091 Unspecified benign mammary dysplasia of right breast: Secondary | ICD-10-CM

## 2016-02-26 LAB — COMPREHENSIVE METABOLIC PANEL
ALBUMIN: 4.1 g/dL (ref 3.5–5.0)
ALK PHOS: 85 U/L (ref 40–150)
ALT: 32 U/L (ref 0–55)
AST: 20 U/L (ref 5–34)
Anion Gap: 9 mEq/L (ref 3–11)
BILIRUBIN TOTAL: 0.49 mg/dL (ref 0.20–1.20)
BUN: 18.6 mg/dL (ref 7.0–26.0)
CO2: 28 mEq/L (ref 22–29)
CREATININE: 0.7 mg/dL (ref 0.6–1.1)
Calcium: 9.4 mg/dL (ref 8.4–10.4)
Chloride: 101 mEq/L (ref 98–109)
GLUCOSE: 91 mg/dL (ref 70–140)
Potassium: 3.4 mEq/L — ABNORMAL LOW (ref 3.5–5.1)
SODIUM: 138 meq/L (ref 136–145)
TOTAL PROTEIN: 7.4 g/dL (ref 6.4–8.3)

## 2016-02-26 NOTE — Telephone Encounter (Signed)
Appointment scheduled per 1/30 LOS. Patient given AVS report and calendars with future scheduled appointments.

## 2016-10-15 ENCOUNTER — Other Ambulatory Visit: Payer: Self-pay | Admitting: Plastic and Reconstructive Surgery

## 2016-10-15 DIAGNOSIS — Z01818 Encounter for other preprocedural examination: Secondary | ICD-10-CM

## 2016-10-20 ENCOUNTER — Other Ambulatory Visit: Payer: Self-pay | Admitting: General Surgery

## 2016-10-20 DIAGNOSIS — Z01818 Encounter for other preprocedural examination: Secondary | ICD-10-CM

## 2016-10-21 ENCOUNTER — Ambulatory Visit
Admission: RE | Admit: 2016-10-21 | Discharge: 2016-10-21 | Disposition: A | Discharge status: Home / Self Care | Payer: BLUE CROSS/BLUE SHIELD | Source: Ambulatory Visit | Attending: Plastic and Reconstructive Surgery | Admitting: Plastic and Reconstructive Surgery

## 2016-10-21 DIAGNOSIS — Z01818 Encounter for other preprocedural examination: Secondary | ICD-10-CM

## 2017-01-07 ENCOUNTER — Telehealth: Payer: Self-pay | Admitting: Oncology

## 2017-01-07 NOTE — Telephone Encounter (Signed)
Spoke with patient re 01/28/17 appointment with Dr. Darnelle CatalanMagrinat. Date/time per patient. Patient given date/time/location. Demographic/insurance information confirmed.

## 2017-01-28 ENCOUNTER — Inpatient Hospital Stay: Payer: BLUE CROSS/BLUE SHIELD | Attending: Oncology | Admitting: Oncology

## 2017-01-28 VITALS — BP 129/88 | HR 77 | Temp 98.0°F | Resp 20 | Ht 62.0 in | Wt 158.2 lb

## 2017-01-28 DIAGNOSIS — N6091 Unspecified benign mammary dysplasia of right breast: Secondary | ICD-10-CM | POA: Diagnosis not present

## 2017-01-28 MED ORDER — TAMOXIFEN CITRATE 20 MG PO TABS: 20.0000 mg | ORAL_TABLET | Freq: Every day | ORAL | 12 refills | Status: DC

## 2017-01-28 NOTE — Progress Notes (Signed)
[place]  [phone]) [phone] [phone]) [phone]     ID: Jessica Navarro DOB: September 12, 1970  MR#: [id number]  FFM#:[id number]  Patient Care Team: Raina Mina., MD as PCP - General (Internal Medicine) Fanny Skates, MD as Consulting Physician (General Surgery) Delsa Bern, MD as Consulting Physician (Obstetrics and Gynecology) Druscilla Brownie, MD as Consulting Physician (Dermatology) Jackquline Denmark, MD as Consulting Physician (Internal Medicine) Bobetta Lime, MD OTHER MD:  CHIEF COMPLAINT: Atypical lobular hyperplasia  CURRENT TREATMENT: Tamoxifen   HISTORY OF CURRENT ILLNESS: The patient had bilateral screening mammography 11/28/2015 showing new calcifications in both breasts.  She was set up for bilateral diagnostic mammography at the breast center 12/10/2015.  This found the breast density to be category C.  There were multiple areas of scattered microcalcifications in both breasts.  The largest individual group in the right breast measured 0.5 cm in the posterior upper outer quadrant.  In the left breast the largest group spanned 0.5 cm in the outer left breast.  In general these were all felt to be most likely benign and six-month follow-up versus biopsy was suggested.    The patient opted for biopsy, which was performed 12/12/2015, and showed ([id number]) in the left breast, only fibrocystic changes with calcifications.  In the right breast however there was atypical lobular hyperplasia.  The patient was referred to surgery and after appropriate discussion with Dr. Dalbert Batman she proceeded to right lumpectomy on 01/08/2016.  The final pathology here ([id number]) showed only fibrocystic changes.  Note that the biopsy site was present in the biopsy clip was identified.  The patient's subsequent history is as detailed below.  INTERVAL HISTORY: Jessica Navarro was evaluated in the high-risk clinic 01/28/2046, for a second opinion regarding management of her  atypical lobular hyperplasia.  She tells me she did well with her surgery, with no unusual pain, fever, bleeding, or other complications.  She met with my partner Dr. Burr Medico approximately a year ago for her first discussion of risk management, and is here today for a second opinion   REVIEW OF SYSTEMS: There were no specific symptoms leading to the original mammogram, which was routinely scheduled. The patient denies unusual headaches, visual changes, nausea, vomiting, stiff neck, dizziness, or gait imbalance. There has been no cough, phlegm production, or pleurisy, no chest pain or pressure, and no change in bowel or bladder habits. The patient denies fever, rash, bleeding, unexplained fatigue or unexplained weight loss. A detailed review of systems was otherwise entirely negative.    PAST MEDICAL HISTORY: Past Medical History:  Diagnosis Date   Abnormal pap    Allergy    Anxiety    Arthritis    back   Asthma    as a child   Atypical lobular hyperplasia (ALH) of right breast 01/08/2016   Bipolar disorder ([place])    Endometriosis    Headache    migraines   HSV-2 (herpes simplex virus 2) infection    Hypertension    Hypothyroidism    Infertility, female    Thyroid disease    hypothyroidism    PAST SURGICAL HISTORY: Past Surgical History:  Procedure Laterality Date   BREAST LUMPECTOMY WITH RADIOACTIVE SEED LOCALIZATION Right 01/08/2016   Procedure: RIGHT BREAST LUMPECTOMY WITH RADIOACTIVE SEED LOCALIZATION;  Surgeon: Fanny Skates, MD;  Location: [facility];  Service: General;  Laterality: Right;   CESAREAN SECTION  2007   COLONOSCOPY     foot [facility]     SINUSOTOMY  FAMILY HISTORY Family History  Problem Relation Age of Onset   Fibromyalgia Mother    Alzheimer's disease Paternal Grandmother    Stomach cancer Paternal Grandfather 64  The patient's mother was adopted.  She does not know her biological family.  She is 47 years old as of January 2019.   The patient's father is 72 year old as of January 2019.  Patient has 1 brother, no sisters.  There is no history of breast or ovarian cancer in the family to the patient's knowledge.  GYNECOLOGIC HISTORY:  No LMP recorded. Patient is not currently having periods (Reason: IUD). 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  Their twins are Michiel Cowboy and Novelty, both [place] as of January 2019.  The patient attends a local [place]: Not in place  HEALTH MAINTENANCE: Social History   Tobacco Use   Smoking status: Never Smoker   Smokeless tobacco: Never Used  Substance Use Topics   Alcohol use: Yes    Alcohol/week: 0.0 oz    Types: 1 - 2 Standard drinks or equivalent per week    Comment: occasional, 1-2 times a week, cocktail    Drug use: No     Colonoscopy: Age 69/ [facility]  PAP: UTD/ [facility]  Bone density:   Allergies  Allergen Reactions   Sulfa Antibiotics Nausea And Vomiting    Current Outpatient Medications  Medication Sig Dispense Refill   ALPRAZolam (XANAX) 1 MG tablet Take 1 mg by mouth daily as needed (for anxiety).      Aspirin-Salicylamide-Caffeine (BC HEADACHE POWDER PO) Take 1 packet by mouth daily as needed (for mild headaches.).     butalbital-acetaminophen-caffeine (FIORICET, ESGIC) 50-325-40 MG per tablet Take 1-2 tablets by mouth every 6 (six) hours as needed (for migraine headaches.).      carvedilol (COREG) 12.5 MG tablet Take 12.5 mg by mouth 2 (two) times daily.  4   hydrochlorothiazide (HYDRODIURIL) 25 MG tablet Take 25 mg by mouth daily.  4   levonorgestrel (MIRENA) 20 MCG/24HR IUD 1 each by Intrauterine route once.     levothyroxine (SYNTHROID, LEVOTHROID) 50 MCG tablet Take 50 mcg by mouth daily before  breakfast.      OnabotulinumtoxinA (BOTOX IJ) Inject as directed. Gets botox injections every 3 months for migraines     promethazine (PHENERGAN) 25 MG tablet Take 25 mg by mouth every 4 (four) hours as needed (for migraine induced nausea).      rizatriptan (MAXALT) 10 MG tablet Take 10 mg by mouth daily as needed (for migraine headaches.).      valACYclovir (VALTREX) 500 MG tablet Take 500 mg by mouth daily as needed. For cold sores.  0   vortioxetine HBr (TRINTELLIX) 10 MG TABS Take 10 mg by mouth at bedtime.     No current facility-administered medications for this visit.     OBJECTIVE: Middle-aged white woman who appears younger than stated age  12:   01/28/17 1606  BP: 129/88  Pulse: 77  Resp: 20  Temp: 98 F (36.7 C)  SpO2: 100%     Body mass index is 28.94 kg/m.   Wt Readings from Last 3 Encounters:  01/28/17 158 lb 3.2 oz (71.8 kg)  02/26/16 172 lb 8 oz (78.2 kg)  01/08/16 170  lb (77.1 kg)      ECOG FS:0 - Asymptomatic  Ocular: Sclerae unicteric, pupils round and equal Ear-nose-throat: Oropharynx clear and moist Lymphatic: No cervical or supraclavicular adenopathy Lungs no rales or rhonchi Heart regular rate and rhythm Abd soft, nontender, positive bowel sounds MSK no focal spinal tenderness, no joint edema Neuro: non-focal, well-oriented, appropriate affect Breasts: The right breast is status post lumpectomy.  The cosmetic result is excellent.  There are no suspicious masses and no skin or nipple changes of concern.  Left breast is unremarkable.  Both axillae are benign.   LAB RESULTS:  CMP     Component Value Date/Time   NA 138 02/26/2016 1218   K 3.4 (L) 02/26/2016 1218   CL 101 01/08/2016 0937   CO2 28 02/26/2016 1218   GLUCOSE 91 02/26/2016 1218   BUN 18.6 02/26/2016 1218   CREATININE 0.7 02/26/2016 1218   CALCIUM 9.4 02/26/2016 1218   PROT 7.4 02/26/2016 1218   ALBUMIN 4.1 02/26/2016 1218   AST 20 02/26/2016 1218   ALT 32 02/26/2016 1218    ALKPHOS 85 02/26/2016 1218   BILITOT 0.49 02/26/2016 1218   GFRNONAA >60 01/08/2016 0937   GFRAA >60 01/08/2016 0937    No results found for: TOTALPROTELP, ALBUMINELP, A1GS, A2GS, BETS, BETA2SER, GAMS, MSPIKE, SPEI  No results found for: KPAFRELGTCHN, LAMBDASER, KAPLAMBRATIO  Lab Results  Component Value Date   WBC 6.1 01/08/2016   HGB 13.5 01/08/2016   HCT 40.2 01/08/2016   MCV 93.1 01/08/2016   PLT 250 01/08/2016    '@LASTCHEMISTRY'$ @  No results found for: LABCA2  No components found for: OIZTIW580  No results for input(s): INR in the last 168 hours.  No results found for: LABCA2  No results found for: DXI338  No results found for: SNK539  No results found for: JQB341  No results found for: CA2729  No components found for: HGQUANT  No results found for: CEA1 / No results found for: CEA1   No results found for: AFPTUMOR  No results found for: CHROMOGRNA  No results found for: PSA1  No visits with results within 3 Day(s) from this visit.  Latest known visit with results is:  Appointment on 02/26/2016  Component Date Value Ref Range Status   Sodium 02/26/2016 138  136 - 145 mEq/L Final   Potassium 02/26/2016 3.4* 3.5 - 5.1 mEq/L Final   Chloride 02/26/2016 101  98 - 109 mEq/L Final   CO2 02/26/2016 28  22 - 29 mEq/L Final   Glucose 02/26/2016 91  70 - 140 mg/dl Final   Glucose reference range is for nonfasting patients. Fasting glucose reference range is 70- 100.   BUN 02/26/2016 18.6  7.0 - 26.0 mg/dL Final   Creatinine 02/26/2016 0.7  0.6 - 1.1 mg/dL Final   Total Bilirubin 02/26/2016 0.49  0.20 - 1.20 mg/dL Final   Alkaline Phosphatase 02/26/2016 85  40 - 150 U/L Final   AST 02/26/2016 20  5 - 34 U/L Final   ALT 02/26/2016 32  0 - 55 U/L Final   Total Protein 02/26/2016 7.4  6.4 - 8.3 g/dL Final   Albumin 02/26/2016 4.1  3.5 - 5.0 g/dL Final   Calcium 02/26/2016 9.4  8.4 - 10.4 mg/dL Final   Anion Gap 02/26/2016 9  3 - 11 mEq/L Final    EGFR 02/26/2016 >90  >90 ml/min/1.73 m2 Final   eGFR is calculated using the CKD-EPI Creatinine Equation (2009)    (this displays the  last labs from the last 3 days)  No results found for: TOTALPROTELP, ALBUMINELP, A1GS, A2GS, BETS, BETA2SER, GAMS, MSPIKE, SPEI (this displays SPEP labs)  No results found for: KPAFRELGTCHN, LAMBDASER, KAPLAMBRATIO (kappa/lambda light chains)  No results found for: HGBA, HGBA2QUANT, HGBFQUANT, HGBSQUAN (Hemoglobinopathy evaluation)   No results found for: LDH  No results found for: IRON, TIBC, IRONPCTSAT (Iron and TIBC)  No results found for: FERRITIN  Urinalysis No results found for: COLORURINE, APPEARANCEUR, LABSPEC, PHURINE, GLUCOSEU, HGBUR, BILIRUBINUR, KETONESUR, PROTEINUR, UROBILINOGEN, NITRITE, LEUKOCYTESUR   STUDIES: Bilateral diagnostic mammography with tomography at the breast center 10/21/2016 showed the breast density to be category C.  This showed stable calcifications, no evidence of malignancy.  ELIGIBLE FOR AVAILABLE RESEARCH PROTOCOL: no  ASSESSMENT: 47 y.o. Jessica Navarro woman status post right breast biopsy 12/12/2015 showing atypical lobular hyperplasia, subsequent lumpectomy 01/08/2016 showing no residual ALH  (1) additional risk factors:  (a) first live birth after age 44  (b) breast density category C  (c) unknown family history on mother's side  (2) intensified screening: Yearly mammography with tomography and biannual breast exam  (3) risk reduction strategy: Tamoxifen started 01/28/2017  PLAN: We spent the better part of today's 50-minute appointment discussing the biology of breast cancer in general, and the specifics of atypical lobular hyperplasia [ALH] more specifically.Jessica Navarro understands ALH means, first, relatively unrestricted growth of breast cells and, in addition, some morphologically different features from simple hyperplasia. Atypical lobular hyperplasia is not breast cancer but can be  associated with invasive cancer and for that reason it needs to be removed.  Luckily in her case no invasive disease was noted. However ALH is also a marker of breast cancer risk. The risk of developing breast cancer in patients with ALH falls somewhere between one half and 1% per year. The new cancer can develop in either breast. One half of those tumors would be invasive.  Working from the patient's age to the average survival of women today in the [place] I think Jessica Navarro can expect to live an additional 30 years [+/- 5]. Using the high end of the risk spectrum for purposes of discussion, this would give her a 30% chance of developing breast cancer in her lifetime. This is approximately 3 times the normal risk.  Jessica Navarro has essentially two ways of lowering that risk. One of them is bilateral mastectomies.  This would involve major surgery, likely requiring reconstruction, which may or may not be successful in the patient's eyes..  It may or may not be covered by her insurance.  More importantly there is no evidence of a survival advantage with this approach.    The other, more reasonable risk reduction approach would be to take anti-estrogens.  In general could be tamoxifen, raloxifene/Evista, or an aromatase inhibitor, though because Jessica Navarro is still premenopausal tamoxifen or raloxifene would be the available choices.  We then discussed the possible toxicities, side effects and complications of the tamoxifen group as opposed to the aromatase inhibitors. All information was given to Jessica Navarro in writing. Any of those agents if taken for 5 years would essentially cut the risk of developing a new breast cancer in half.  We also discussed intensified screening. This could mean adding a yearly MRI to yearly mammography with tomography. This approach greatly increases sensitivity. Any breast cancer that may develop should be found at the earliest possible stage.  That is also the main concern with this approach:  There are likely to be false positives requiring repeated studies, biopsies,  etc.  Also there are issues regarding cost even if partial insurance coverage is obtained.  Finally there is no documented survival advantage to this approach in this population.  A second form of intensified screening would be biannual mammography and biannual breast exams.  The concern here is doubling the mammogram radiation dose.  Again, this is not been shown to increase survival.  I think a reasonable intensified screening approach would involve yearly mammography with tomography and biannual breast exams.  This is sensitive (and will become more sensitive at the patient's breasts become more fat replaced),, less plagued by falls positives, and reasonable from a cost point of view  After this discussion Verner felt comfortable proceeding with tamoxifen as well as yearly mammography with tomography and biannual breast exams.  Note that there are no documented interactions between tamoxifen and vortioxetine that I could locate in PubMed or UpToDate  Also, I find the combination of tamoxifen and a Mirena IUD particularly effective: This tends to decrease the problems with endometrial hyperplasia and the risk of endometrial polyps.  We have data at that this combination does not increase the risk of breast cancer.  Accordingly she will start tamoxifen now.  She will let me know if she has any unusual side effects but otherwise will see me in May and will have a breast exam at that time.  She will have mammography at the breast center September 2019 and follow-up with Dr. Cletis Media in November 2019 which is the month she usually gets her gynecologic checkup.    Rhesa has a good understanding of this plan.  She agrees with it.  She knows the goal of treatment in this case is prevention.  She will call with any other issues that may develop before her next visit here.     Magrinat, Virgie Dad, MD  01/28/17 5:19 PM Medical  Oncology and Hematology [facility] [address] [place], [place] [address] Tel. [phone]    Fax. [phone]  This document serves as a record of services personally performed by Lurline Del, MD. It was created on his behalf by Sheron Nightingale, a trained medical scribe. The creation of this record is based on the scribe's personal observations and the provider's statements to them.   I have reviewed the above documentation for accuracy and completeness, and I agree with the above.

## 2017-01-29 ENCOUNTER — Telehealth: Payer: Self-pay | Admitting: Oncology

## 2017-01-29 NOTE — Telephone Encounter (Signed)
Called patient regarding may 2019

## 2017-03-20 IMAGING — MG BREAST SURGICAL SPECIMEN
1 series · 1 of 1 positions shown · non-contrast
Comparison: Previous exam(s).

CLINICAL DATA: Post right breast excision.

EXAM:
SPECIMEN RADIOGRAPH OF THE RIGHT BREAST

[R]
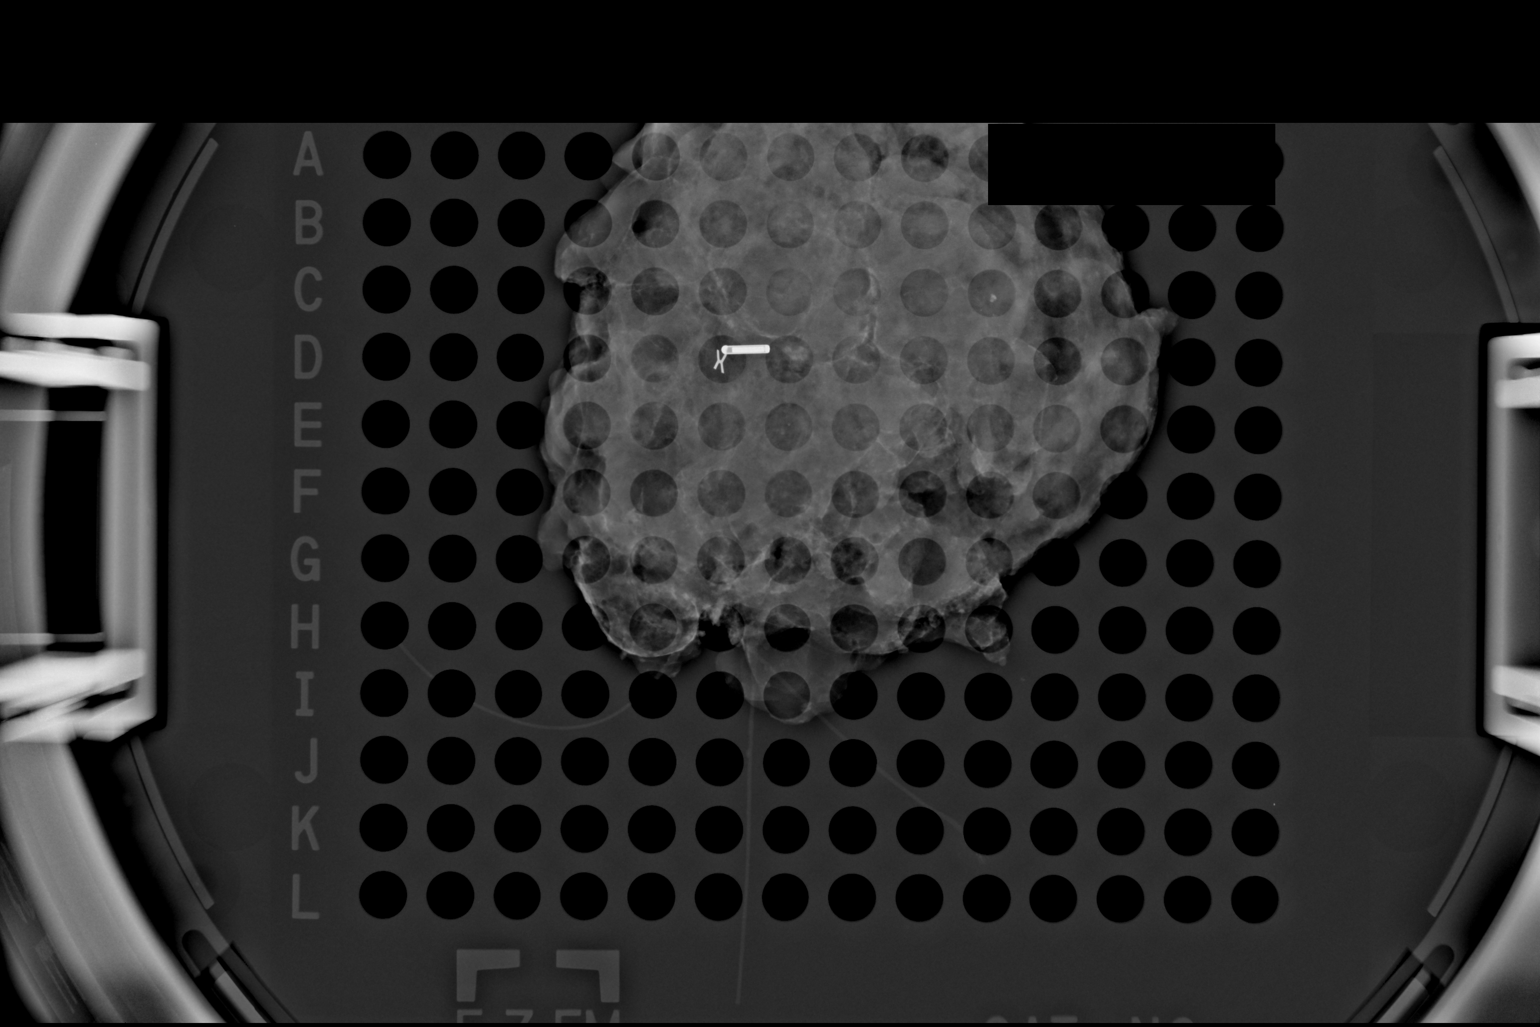

[1 of 1 positions shown; findings below may reference images not displayed]

FINDINGS: Status post excision of the right breast. The radioactive seed and
biopsy marker clip are present, completely intact, and were marked
for pathology.
IMPRESSION: Specimen radiograph of the right breast.

## 2017-04-30 ENCOUNTER — Other Ambulatory Visit: Payer: Self-pay | Admitting: Oncology

## 2017-06-10 ENCOUNTER — Ambulatory Visit: Payer: BLUE CROSS/BLUE SHIELD | Admitting: Oncology

## 2017-06-11 NOTE — Progress Notes (Signed)
[facility]  [phone] [phone]) [phone]     ID: Jessica Navarro DOB: September 28, 1970  MR#: [id number]  NPY#:[id number]  Patient Care Team: Jessica Navarro., MD as PCP - General (Internal Medicine) Jessica Skates, MD as Consulting Physician (General Surgery) Jessica Bern, MD as Consulting Physician (Obstetrics and Gynecology) Jessica Brownie, MD as Consulting Physician (Dermatology) Jessica Denmark, MD as Consulting Physician (Internal Medicine) Jessica Morrow Brooks Sailors, MD as Referring Physician OTHER MD:  CHIEF COMPLAINT: Atypical lobular hyperplasia  CURRENT TREATMENT: Tamoxifen   HISTORY OF CURRENT ILLNESS: From the original intake note:  The patient had bilateral screening mammography 11/28/2015 showing new calcifications in both breasts.  She was set up for bilateral diagnostic mammography at the breast center 12/10/2015.  This found the breast density to be category C.  There were multiple areas of scattered microcalcifications in both breasts.  The largest individual group in the right breast measured 0.5 cm in the posterior upper outer quadrant.  In the left breast the largest group spanned 0.5 cm in the outer left breast.  In general these were all felt to be most likely benign and six-month follow-up versus biopsy was suggested.    The patient opted for biopsy, which was performed 12/12/2015, and showed ([id number]) in the left breast, only fibrocystic changes with calcifications.  In the right breast however there was atypical lobular hyperplasia.  The patient was referred to surgery and after appropriate discussion with Dr. Dalbert Navarro she proceeded to right lumpectomy on 01/08/2016.  The final pathology here ([id number]) showed only fibrocystic changes.  Note that the biopsy site was present and the biopsy clip was identified.  The patient's subsequent history is as detailed below.  INTERVAL HISTORY: Jessica Navarro returns today for a follow-up and  treatment of her atypical lobular hyperplasia.   She continues on tamoxifen, which she started March 2019. She is tolerating it well and adds that she currently has an IUD in place. She is able to obtain tamoxifen for about $50 for a three month supply.   REVIEW OF SYSTEMS: Jessica Navarro is doing well overall. She stays busy working with her husband at his company and taking care of her children. She has a treadmill placed in front of her tv that she uses for exercise. The patient denies unusual headaches, visual changes, nausea, vomiting, or dizziness. There has been no unusual cough, phlegm production, or pleurisy. This been no change in bowel or bladder habits. She denies unexplained fatigue or unexplained weight loss, bleeding, rash, or fever. A detailed review of systems was otherwise noncontributory.   PAST MEDICAL HISTORY: Past Medical History:  Diagnosis Date  . Abnormal pap   . Allergy   . Anxiety   . Arthritis    back  . Asthma    as a child  . Atypical lobular hyperplasia [name]) of right breast 01/08/2016  . Bipolar disorder ([place])   . Endometriosis   . Headache    migraines  . HSV-2 (herpes simplex virus 2) infection   . Hypertension   . Hypothyroidism   . Infertility, female   . Thyroid disease    hypothyroidism    PAST SURGICAL HISTORY: Past Surgical History:  Procedure Laterality Date  . BREAST LUMPECTOMY WITH RADIOACTIVE SEED LOCALIZATION Right 01/08/2016   Procedure: RIGHT BREAST LUMPECTOMY WITH RADIOACTIVE SEED LOCALIZATION;  Surgeon: Jessica Skates, MD;  Location: [place];  Service: General;  Laterality: Right;  . CESAREAN SECTION  2007  . COLONOSCOPY    . foot  [place]    . SINUSOTOMY      FAMILY HISTORY Family History  Problem Relation Age of Onset  . Fibromyalgia Mother   . Alzheimer's disease Paternal Grandmother   . Stomach cancer Paternal Grandfather 32  The patient's mother was adopted.  She does not know her biological family.  She is 48 years old as of  January 2019.  The patient's father is 43 year old as of January 2019.  Patient has 1 brother, no sisters.  There is no history of breast or ovarian cancer in the family to the patient's knowledge.  GYNECOLOGIC HISTORY:  No LMP recorded. (Menstrual status: IUD). 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  Their twins are Jessica Navarro and Jessica Navarro, both [place] as of January 2019.  The patient attends a local [place]: Not in place  HEALTH MAINTENANCE: Social History   Tobacco Use  . Smoking status: Never Smoker  . Smokeless tobacco: Never Used  Substance Use Topics  . Alcohol use: Yes    Alcohol/week: 0.0 oz    Types: 1 - 2 Standard drinks or equivalent per week    Comment: occasional, 1-2 times a week, cocktail   . Drug use: No     Colonoscopy: Age 27/ Jessica Navarro  PAP: UTD/ Jessica Navarro  Bone density:   Allergies  Allergen Reactions  . Sulfa Antibiotics Nausea And Vomiting    Current Outpatient Medications  Medication Sig Dispense Refill  . ALPRAZolam (XANAX) 1 MG tablet Take 1 mg by mouth daily as needed (for anxiety).     . Aspirin-Salicylamide-Caffeine (BC HEADACHE POWDER PO) Take 1 packet by mouth daily as needed (for mild headaches.).    Marland Kitchen butalbital-acetaminophen-caffeine (FIORICET, ESGIC) 50-325-40 MG per tablet Take 1-2 tablets by mouth every 6 (six) hours as needed (for migraine headaches.).     Marland Kitchen carvedilol (COREG) 12.5 MG tablet Take 12.5 mg by mouth 2 (two) times daily.  4  . hydrochlorothiazide (HYDRODIURIL) 25 MG tablet Take 25 mg by mouth daily.  4  . levonorgestrel (MIRENA) 20 MCG/24HR IUD 1 each by Intrauterine route once.    Marland Kitchen levothyroxine (SYNTHROID, LEVOTHROID) 50 MCG tablet Take 50 mcg by mouth daily before breakfast.     .  OnabotulinumtoxinA (BOTOX IJ) Inject as directed. Gets botox injections every 3 months for migraines    . promethazine (PHENERGAN) 25 MG tablet Take 25 mg by mouth every 4 (four) hours as needed (for migraine induced nausea).     . rizatriptan (MAXALT) 10 MG tablet Take 10 mg by mouth daily as needed (for migraine headaches.).     Marland Kitchen valACYclovir (VALTREX) 500 MG tablet Take 500 mg by mouth daily as needed. For cold sores.  0  . vortioxetine HBr (TRINTELLIX) 10 MG TABS Take 10 mg by mouth at bedtime.     No current facility-administered medications for this visit.     OBJECTIVE: Middle-aged white woman in no acute distress Vitals:   06/17/17 1152  BP: 120/87  Pulse: 80  Resp: 18  Temp: 98.3 F (36.8 C)  SpO2: 100%     Body mass index is 30.4 kg/m.   Wt Readings from Last 3 Encounters:  06/17/17 166 lb 3.2 oz (75.4 kg)  01/28/17 158 lb 3.2 oz (71.8 kg)  02/26/16 172 lb 8 oz (78.2 kg)      ECOG FS:0 - Asymptomatic  Sclerae unicteric, EOMs intact Oropharynx clear and moist No cervical or supraclavicular adenopathy Lungs no rales or rhonchi Heart regular rate and rhythm Abd soft, nontender, positive bowel sounds MSK no focal spinal tenderness, no upper extremity lymphedema Neuro: nonfocal, well oriented, appropriate affect Breasts: She has bilateral silicone implants in place.  The right breast is otherwise benign.  Left breast is status post lumpectomy.  Overall the cosmetic result is excellent.  There are no findings of concern in both axillae are benign  LAB RESULTS:  CMP     Component Value Date/Time   NA 138 02/26/2016 1218   K 3.4 (L) 02/26/2016 1218   CL 101 01/08/2016 0937   CO2 28 02/26/2016 1218   GLUCOSE 91 02/26/2016 1218   BUN 18.6 02/26/2016 1218   CREATININE 0.7 02/26/2016 1218   CALCIUM 9.4 02/26/2016 1218   PROT 7.4 02/26/2016 1218   ALBUMIN 4.1 02/26/2016 1218   AST 20 02/26/2016 1218   ALT 32 02/26/2016 1218   ALKPHOS 85 02/26/2016 1218   BILITOT  0.49 02/26/2016 1218   GFRNONAA >60 01/08/2016 0937   GFRAA >60 01/08/2016 0937    No results found for: TOTALPROTELP, ALBUMINELP, A1GS, A2GS, BETS, BETA2SER, GAMS, MSPIKE, SPEI  No results found for: KPAFRELGTCHN, LAMBDASER, KAPLAMBRATIO  Lab Results  Component Value Date   WBC 6.1 01/08/2016   HGB 13.5 01/08/2016   HCT 40.2 01/08/2016   MCV 93.1 01/08/2016   PLT 250 01/08/2016    _0 @  No results found for: LABCA2  No components found for: UUVOZD664  No results for input(s): INR in the last 168 hours.  No results found for: LABCA2  No results found for: QIH474  No results found for: QVZ563  No results found for: OVF643  No results found for: CA2729  No components found for: HGQUANT  No results found for: CEA1 / No results found for: CEA1   No results found for: AFPTUMOR  No results found for: CHROMOGRNA  No results found for: PSA1  No visits with results within 3 Day(s) from this visit.  Latest known visit with results is:  Appointment on 02/26/2016  Component Date Value Ref Range Status  . Sodium 02/26/2016 138  136 - 145 mEq/L Final  . Potassium 02/26/2016 3.4* 3.5 - 5.1 mEq/L Final  . Chloride 02/26/2016 101  98 - 109 mEq/L Final  . CO2 02/26/2016 28  22 - 29 mEq/L Final  . Glucose 02/26/2016 91  70 - 140 mg/dl Final   Glucose reference range is for nonfasting patients. Fasting glucose reference range is 70- 100.  Marland Kitchen BUN 02/26/2016 18.6  7.0 - 26.0 mg/dL Final  . Creatinine 02/26/2016 0.7  0.6 - 1.1 mg/dL Final  . Total Bilirubin 02/26/2016 0.49  0.20 - 1.20 mg/dL Final  . Alkaline Phosphatase 02/26/2016 85  40 - 150 U/L Final  . AST 02/26/2016 20  5 - 34 U/L Final  . ALT 02/26/2016 32  0 - 55 U/L Final  . Total Protein 02/26/2016 7.4  6.4 - 8.3 g/dL Final  . Albumin 02/26/2016 4.1  3.5 - 5.0 g/dL Final  . Calcium 02/26/2016 9.4  8.4 - 10.4 mg/dL Final  . Anion Gap 02/26/2016 9  3 - 11 mEq/L Final  . EGFR 02/26/2016 >90  >90 ml/min/1.73  m2 Final   eGFR is calculated using the CKD-EPI Creatinine Equation (2009)    (  this displays the last labs from the last 3 days)  No results found for: TOTALPROTELP, ALBUMINELP, A1GS, A2GS, BETS, BETA2SER, GAMS, MSPIKE, SPEI (this displays SPEP labs)  No results found for: KPAFRELGTCHN, LAMBDASER, KAPLAMBRATIO (kappa/lambda light chains)  No results found for: HGBA, HGBA2QUANT, HGBFQUANT, HGBSQUAN (Hemoglobinopathy evaluation)   No results found for: LDH  No results found for: IRON, TIBC, IRONPCTSAT (Iron and TIBC)  No results found for: FERRITIN  Urinalysis No results found for: COLORURINE, APPEARANCEUR, LABSPEC, PHURINE, GLUCOSEU, HGBUR, BILIRUBINUR, KETONESUR, PROTEINUR, UROBILINOGEN, NITRITE, LEUKOCYTESUR   STUDIES: Mammography due September 2019.  She also will need every 3-year MRIs given that she has silicone implants in place  ELIGIBLE FOR AVAILABLE RESEARCH PROTOCOL: no  ASSESSMENT: 47 y.o. [place] woman status post right breast biopsy 12/12/2015 showing atypical lobular hyperplasia, subsequent lumpectomy 01/08/2016 showing no residual ALH  (1) additional risk factors:  (a) first live birth after age 4  (b) breast density category C  (c) unknown family history on mother's side  (2) intensified screening:   (a) Yearly mammography with tomography and biannual breast exam  (b) every 3-year breast MRI given silicone implants are in place  (3) risk reduction strategy: Tamoxifen started March 2019  (a) Mirena in place  PLAN: D[place]a is tolerating tamoxifen remarkably well and the plan will be to continue that to a total of 5 years.  It seems to me the cost of her medication is on the high side.  I have given her a written prescription so that she may "shop it".  She will follow-up with Dr. Modena Morrow in September.  Accordingly I will see her again in March and from that point I can start to see her on a yearly basis.  She will have mammography in  September.  She will need a breast MRI sometime within the next year for routine follow-up of her silicone implants  I have encouraged her to call with any problems that may develop before the next visit.    Paul Torpey, Virgie Dad, MD  06/17/17 12:17 PM Medical Oncology and Hematology [facility] [address] [place], [place] [address] Tel. [phone]    Fax. [phone]  This document serves as a record of services personally performed by Chauncey Cruel, MD. It was created on his behalf by Margit Banda, a trained medical scribe. The creation of this record is based on the scribe's personal observations and the provider's statements to them.   I have reviewed the above documentation for accuracy and completeness, and I agree with the above.

## 2017-06-17 ENCOUNTER — Telehealth: Payer: Self-pay | Admitting: Oncology

## 2017-06-17 ENCOUNTER — Inpatient Hospital Stay: Payer: BLUE CROSS/BLUE SHIELD | Attending: Oncology | Admitting: Oncology

## 2017-06-17 VITALS — BP 120/87 | HR 80 | Temp 98.3°F | Resp 18 | Ht 62.0 in | Wt 166.2 lb

## 2017-06-17 DIAGNOSIS — Z79810 Long term (current) use of selective estrogen receptor modulators (SERMs): Secondary | ICD-10-CM | POA: Insufficient documentation

## 2017-06-17 DIAGNOSIS — N6091 Unspecified benign mammary dysplasia of right breast: Secondary | ICD-10-CM | POA: Insufficient documentation

## 2017-06-17 NOTE — Telephone Encounter (Signed)
Gave patient AVs and calendar of upcoming March 2020 appointments.  °

## 2017-08-06 ENCOUNTER — Other Ambulatory Visit: Payer: Self-pay | Admitting: Obstetrics and Gynecology

## 2017-08-06 DIAGNOSIS — N62 Hypertrophy of breast: Secondary | ICD-10-CM

## 2017-08-14 ENCOUNTER — Ambulatory Visit
Admission: RE | Admit: 2017-08-14 | Discharge: 2017-08-14 | Disposition: A | Discharge status: Home / Self Care | Payer: Self-pay | Source: Ambulatory Visit | Attending: Obstetrics and Gynecology | Admitting: Obstetrics and Gynecology

## 2017-08-14 DIAGNOSIS — N62 Hypertrophy of breast: Secondary | ICD-10-CM

## 2017-08-14 MED ORDER — GADOBENATE DIMEGLUMINE 529 MG/ML IV SOLN
14.0000 mL | Freq: Once | INTRAVENOUS | Status: AC | PRN
  Administered 2017-08-14: 14 mL via INTRAVENOUS

## 2017-08-17 ENCOUNTER — Encounter: Payer: Self-pay | Admitting: Diagnostic Radiology

## 2017-08-31 ENCOUNTER — Other Ambulatory Visit: Payer: Self-pay | Admitting: Oncology

## 2017-08-31 DIAGNOSIS — Z1231 Encounter for screening mammogram for malignant neoplasm of breast: Secondary | ICD-10-CM

## 2017-10-22 ENCOUNTER — Ambulatory Visit
Admission: RE | Admit: 2017-10-22 | Discharge: 2017-10-22 | Disposition: A | Discharge status: Home / Self Care | Payer: BLUE CROSS/BLUE SHIELD | Source: Ambulatory Visit | Attending: Oncology | Admitting: Oncology

## 2017-10-22 DIAGNOSIS — Z1231 Encounter for screening mammogram for malignant neoplasm of breast: Secondary | ICD-10-CM

## 2017-12-16 ENCOUNTER — Other Ambulatory Visit: Payer: Self-pay | Admitting: Obstetrics and Gynecology

## 2017-12-16 DIAGNOSIS — N63 Unspecified lump in unspecified breast: Secondary | ICD-10-CM

## 2017-12-21 ENCOUNTER — Other Ambulatory Visit: Payer: Self-pay | Admitting: Oncology

## 2017-12-21 ENCOUNTER — Other Ambulatory Visit: Payer: Self-pay | Admitting: Obstetrics and Gynecology

## 2017-12-21 ENCOUNTER — Ambulatory Visit
Admission: RE | Admit: 2017-12-21 | Discharge: 2017-12-21 | Disposition: A | Discharge status: Home / Self Care | Payer: BLUE CROSS/BLUE SHIELD | Source: Ambulatory Visit | Attending: Obstetrics and Gynecology | Admitting: Obstetrics and Gynecology

## 2017-12-21 DIAGNOSIS — N63 Unspecified lump in unspecified breast: Secondary | ICD-10-CM

## 2018-03-24 ENCOUNTER — Other Ambulatory Visit: Payer: BLUE CROSS/BLUE SHIELD

## 2018-03-26 ENCOUNTER — Ambulatory Visit
Admission: RE | Admit: 2018-03-26 | Discharge: 2018-03-26 | Disposition: A | Discharge status: Home / Self Care | Payer: BLUE CROSS/BLUE SHIELD | Source: Ambulatory Visit | Attending: Oncology | Admitting: Oncology

## 2018-03-26 DIAGNOSIS — N63 Unspecified lump in unspecified breast: Secondary | ICD-10-CM

## 2018-04-08 ENCOUNTER — Other Ambulatory Visit: Payer: Self-pay | Admitting: Oncology

## 2018-04-16 NOTE — Progress Notes (Signed)
Jessica Navarro  Telephone:([phone]) [phone] Fax:([phone]) [phone]    ID: Jessica Navarro DOB: 09-26-1970  MR#: [id number]  ESP#:[id number]  Patient Care Team: Jessica Bern, MD as PCP - General (Obstetrics and Gynecology) Jessica Skates, MD as Consulting Physician (General Surgery) Jessica Bern, MD as Consulting Physician (Obstetrics and Gynecology) Jessica Brownie, MD as Consulting Physician (Dermatology) Jessica Denmark, MD as Consulting Physician (Internal Medicine) Jessica Morrow Brooks Sailors, MD as Referring Physician OTHER MD:   CHIEF COMPLAINT: Atypical lobular hyperplasia  CURRENT TREATMENT: Tamoxifen; intensified screening   HISTORY OF CURRENT ILLNESS: From the original intake note:  The patient had bilateral screening mammography 11/28/2015 showing new calcifications in both breasts.  She was set up for bilateral diagnostic mammography at the breast center 12/10/2015.  This found the breast density to be category C.  There were multiple areas of scattered microcalcifications in both breasts.  The largest individual group in the right breast measured 0.5 cm in the posterior upper outer quadrant.  In the left breast the largest group spanned 0.5 cm in the outer left breast.  In general these were all felt to be most likely benign and six-month follow-up versus biopsy was suggested.    The patient opted for biopsy, which was performed 12/12/2015, and showed ([id number]) in the left breast, only fibrocystic changes with calcifications.  In the right breast however there was atypical lobular hyperplasia.  The patient was referred to surgery and after appropriate discussion with Dr. Dalbert Navarro she proceeded to right lumpectomy on 01/08/2016.  The final pathology here ([id number]) showed only fibrocystic changes.  Note that the biopsy site was present and the biopsy clip was identified.  The patient's subsequent history is as detailed below.   INTERVAL HISTORY: Jessica Navarro returns  today for follow-up and treatment of her atypical lobular hyperplasia.   She continues on tamoxifen. She has noticed that her hair has become more dry and that she has had some hair loss since taking this medication. She does not notice any hot flashes or vaginal dryness.   Since her last visit here, she underwent a bilateral breast MRI with and without contrast on 08/14/2017 showing: Breast Density Category C. No abnormal enhancement in either breast.   She also underwent a digital screening bilateral mammogram with implants on 10/22/2017 showing: Breast Density Category C. There is no mammographic evidence of malignancy.   In addition, she underwent a digital diagnostic left mammogram with implants and a left breast ultrasound for a palpable lump inferiorly in the left breast on 12/21/2017 showing: Breast Density Category B. On the spot view, the palpable lump appears to correlate with a rounded region of fat necrosis. On physical exam, a firm palpable lump is identified by the patient and myself. Targeted ultrasound is performed, showing a mixed echoic mass measuring 1.4 x 1.3 x 2.3 cm. The mass is mostly hyperechoic with some internal regions of hypoechogenicity.  She then underwent a repeat digital diagnostic left mammogram with implants and a left breast ultrasound on 03/26/2018 showing: Breast Density Category B. 2D/3D full field and spot compression views of the left breast demonstrate no suspicious mass, nonsurgical distortion or worrisome calcifications. Biopsy clip within the upper-outer left breast again Identified. Targeted ultrasound is performed, showing a 1.4 x 1 x 1.9 cm hyperechoic area with increased cystic changes at the 6 o'clock position of the left breast 7 cm from the nipple, measuring 1.4 x 1.3 x 2.3 cm on 12/21/2017. This is compatible with improving benign fat necrosis.  REVIEW OF SYSTEMS: Jessica Navarro's children have been at home amid the COVID-19 concerns. The patient denies  unusual headaches, visual changes, nausea, vomiting, or dizziness. There has been no unusual cough, phlegm production, or pleurisy. This been no change in bowel or bladder habits. The patient denies unexplained fatigue or unexplained weight loss, bleeding, rash, or fever. A detailed review of systems was otherwise noncontributory.    PAST MEDICAL HISTORY: Past Medical History:  Diagnosis Date   Abnormal pap    Allergy    Anxiety    Arthritis    back   Asthma    as a child   Atypical lobular hyperplasia (ALH) of right breast 01/08/2016   Bipolar disorder ([place])    Endometriosis    Headache    migraines   HSV-2 (herpes simplex virus 2) infection    Hypertension    Hypothyroidism    Infertility, female    Thyroid disease    hypothyroidism    PAST SURGICAL HISTORY: Past Surgical History:  Procedure Laterality Date   AUGMENTATION MAMMAPLASTY Bilateral    silicone on top of muscle   BREAST BIOPSY Left    benign   BREAST EXCISIONAL BIOPSY Right    BREAST LUMPECTOMY WITH RADIOACTIVE SEED LOCALIZATION Right 01/08/2016   Procedure: RIGHT BREAST LUMPECTOMY WITH RADIOACTIVE SEED LOCALIZATION;  Surgeon: Jessica Skates, MD;  Location: [place] [facility];  Service: General;  Laterality: Right;   CESAREAN SECTION  2007   COLONOSCOPY     foot surgerey     SINUSOTOMY      FAMILY HISTORY Family History  Problem Relation Age of Onset   Fibromyalgia Mother    Alzheimer's disease Paternal Grandmother    Stomach cancer Paternal Grandfather 68   Breast cancer Neg Hx    The patient's mother was adopted.  She does not know her biological family.  She is 50 years old as of January 2019.  The patient's father is 7 year old as of January 2019.  Patient has 1 brother, no sisters.  There is no history of breast or ovarian cancer in the family to the patient's knowledge.   GYNECOLOGIC HISTORY:  No LMP recorded. (Menstrual status: IUD). 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  Their twins are Michiel Cowboy and [place], both [place] as of January 2019.  The patient attends a local [place]: Not in place   HEALTH MAINTENANCE: Social History   Tobacco Use   Smoking status: Never Smoker   Smokeless tobacco: Never Used  Substance Use Topics   Alcohol use: Yes    Alcohol/week: 1.0 - 2.0 standard drinks    Types: 1 - 2 Standard drinks or equivalent per week    Comment: occasional, 1-2 times a week, cocktail    Drug use: No     Colonoscopy: Age 46/ [facility]  PAP: UTD/ [facility]  Bone density:   Allergies  Allergen Reactions   Sulfa Antibiotics Nausea And Vomiting    Current Outpatient Medications  Medication Sig Dispense Refill   ALPRAZolam (XANAX) 1 MG tablet Take 1 mg by mouth daily as needed (for anxiety).      Aspirin-Salicylamide-Caffeine (BC HEADACHE POWDER PO) Take 1 packet by mouth daily as needed (for mild headaches.).     butalbital-acetaminophen-caffeine (FIORICET, ESGIC)  50-325-40 MG per tablet Take 1-2 tablets by mouth every 6 (six) hours as needed (for migraine headaches.).      carvedilol (COREG) 12.5 MG tablet Take 12.5 mg by mouth 2 (two) times daily.  4   hydrochlorothiazide (HYDRODIURIL) 25 MG tablet Take 25 mg by mouth daily.  4   levonorgestrel (MIRENA) 20 MCG/24HR IUD 1 each by Intrauterine route once.     levothyroxine (SYNTHROID, LEVOTHROID) 50 MCG tablet Take 50 mcg by mouth daily before breakfast.      OnabotulinumtoxinA (BOTOX IJ) Inject as directed. Gets botox injections every 3 months for migraines     promethazine (PHENERGAN) 25 MG tablet Take 25 mg by mouth every 4 (four) hours as needed (for migraine induced nausea).      rizatriptan (MAXALT) 10 MG tablet Take 10 mg by  mouth daily as needed (for migraine headaches.).      tamoxifen (NOLVADEX) 20 MG tablet Take 1 tablet (20 mg total) by mouth daily. 90 tablet 4   valACYclovir (VALTREX) 500 MG tablet Take 500 mg by mouth daily as needed. For cold sores.  0   vortioxetine HBr (TRINTELLIX) 10 MG TABS Take 10 mg by mouth at bedtime.     No current facility-administered medications for this visit.     OBJECTIVE: Middle-aged white woman who appears well  Vitals:   04/19/18 1128  BP: 120/79  Pulse: 76  Resp: 18  Temp: 98.2 F (36.8 C)  SpO2: 100%     Body mass index is 33.43 kg/m.   Wt Readings from Last 3 Encounters:  04/19/18 172 lb 4.8 oz (78.2 kg)  06/17/17 166 lb 3.2 oz (75.4 kg)  01/28/17 158 lb 3.2 oz (71.8 kg)      ECOG FS:0 - Asymptomatic  Sclerae unicteric, pupils round and equal Oropharynx clear and moist No cervical or supraclavicular adenopathy Lungs no rales or rhonchi Heart regular rate and rhythm Abd soft, nontender, positive bowel sounds MSK no focal spinal tenderness, no upper extremity lymphedema Neuro: nonfocal, well oriented, appropriate affect Breasts: The right breast is unremarkable.  There is a silicone implant in place.  There are minor irregularities at the 3 o'clock position and laterally which the patient is aware of, and are not new.  The left breast is status post lumpectomy, also with silicone implant in place.  In the inferior aspect of the breast, in the inframammary fold, there is an easily palpable firmer spot which is movable and measures approximately 2 mm.  This is the area of fat necrosis otherwise noted on the mammograms.  Both axillae are benign.  LAB RESULTS:  CMP     Component Value Date/Time   NA 138 02/26/2016 1218   K 3.4 (L) 02/26/2016 1218   CL 101 01/08/2016 0937   CO2 28 02/26/2016 1218   GLUCOSE 91 02/26/2016 1218   BUN 18.6 02/26/2016 1218   CREATININE 0.7 02/26/2016 1218   CALCIUM 9.4 02/26/2016 1218   PROT 7.4 02/26/2016 1218    ALBUMIN 4.1 02/26/2016 1218   AST 20 02/26/2016 1218   ALT 32 02/26/2016 1218   ALKPHOS 85 02/26/2016 1218   BILITOT 0.49 02/26/2016 1218   GFRNONAA >60 01/08/2016 0937   GFRAA >60 01/08/2016 0937    No results found for: TOTALPROTELP, ALBUMINELP, A1GS, A2GS, BETS, BETA2SER, GAMS, MSPIKE, SPEI  No results found for: KPAFRELGTCHN, LAMBDASER, KAPLAMBRATIO  Lab Results  Component Value Date   WBC 6.1 01/08/2016   HGB 13.5 01/08/2016   HCT  40.2 01/08/2016   MCV 93.1 01/08/2016   PLT 250 01/08/2016    '@LASTCHEMISTRY'$ @  No results found for: LABCA2  No components found for: FVCBSW967  No results for input(s): INR in the last 168 hours.  No results found for: LABCA2  No results found for: RFF638  No results found for: GYK599  No results found for: JTT017  No results found for: CA2729  No components found for: HGQUANT  No results found for: CEA1 / No results found for: CEA1   No results found for: AFPTUMOR  No results found for: CHROMOGRNA  No results found for: PSA1  No visits with results within 3 Day(s) from this visit.  Latest known visit with results is:  Appointment on 02/26/2016  Component Date Value Ref Range Status   Sodium 02/26/2016 138  136 - 145 mEq/L Final   Potassium 02/26/2016 3.4* 3.5 - 5.1 mEq/L Final   Chloride 02/26/2016 101  98 - 109 mEq/L Final   CO2 02/26/2016 28  22 - 29 mEq/L Final   Glucose 02/26/2016 91  70 - 140 mg/dl Final   Glucose reference range is for nonfasting patients. Fasting glucose reference range is 70- 100.   BUN 02/26/2016 18.6  7.0 - 26.0 mg/dL Final   Creatinine 02/26/2016 0.7  0.6 - 1.1 mg/dL Final   Total Bilirubin 02/26/2016 0.49  0.20 - 1.20 mg/dL Final   Alkaline Phosphatase 02/26/2016 85  40 - 150 U/L Final   AST 02/26/2016 20  5 - 34 U/L Final   ALT 02/26/2016 32  0 - 55 U/L Final   Total Protein 02/26/2016 7.4  6.4 - 8.3 g/dL Final   Albumin 02/26/2016 4.1  3.5 - 5.0 g/dL Final   Calcium  02/26/2016 9.4  8.4 - 10.4 mg/dL Final   Anion Gap 02/26/2016 9  3 - 11 mEq/L Final   EGFR 02/26/2016 >90  >90 ml/min/1.73 m2 Final   eGFR is calculated using the CKD-EPI Creatinine Equation (2009)    (this displays the last labs from the last 3 days)  No results found for: TOTALPROTELP, ALBUMINELP, A1GS, A2GS, BETS, BETA2SER, GAMS, MSPIKE, SPEI (this displays SPEP labs)  No results found for: KPAFRELGTCHN, LAMBDASER, KAPLAMBRATIO (kappa/lambda light chains)  No results found for: HGBA, HGBA2QUANT, HGBFQUANT, HGBSQUAN (Hemoglobinopathy evaluation)   No results found for: LDH  No results found for: IRON, TIBC, IRONPCTSAT (Iron and TIBC)  No results found for: FERRITIN  Urinalysis No results found for: COLORURINE, APPEARANCEUR, LABSPEC, PHURINE, GLUCOSEU, HGBUR, BILIRUBINUR, KETONESUR, PROTEINUR, UROBILINOGEN, NITRITE, LEUKOCYTESUR   STUDIES: US Breast Ltd Uni Left Inc Axilla  Result Date: 03/26/2018 CLINICAL DATA:  48 year old female for 3 months follow-up of probable LEFT breast fat necrosis. History of LEFT mammoplasty and augmentation. EXAM: DIGITAL DIAGNOSTIC LEFT MAMMOGRAM WITH IMPLANTS, CAD AND TOMO ULTRASOUND LEFT BREAST The patient has a retroglandular implant. Standard and implant displaced views were performed. COMPARISON:  Previous exam(s). ACR Breast Density Category b: There are scattered areas of fibroglandular density. FINDINGS: 2D/3D full field and spot compression views of the LEFT breast demonstrate no suspicious mass, nonsurgical distortion or worrisome calcifications. Biopsy clip within the UPPER-OUTER LEFT breast again identified. Mammographic images were processed with CAD. Targeted ultrasound is performed, showing a 1.4 x 1 x 1.9 cm hyperechoic area with increased cystic changes at the 6 o'clock position of the LEFT breast 7 cm from the nipple, measuring 1.4 x 1.3 x 2.3 cm on 12/21/2017. This is compatible with improving benign fat necrosis. IMPRESSION: Slightly  smaller area of benign fat necrosis within the LOWER LEFT breast. No further imaging follow-up recommended. RECOMMENDATION: Bilateral screening mammogram in 7 months to resume annual mammogram schedule. I have discussed the findings and recommendations with the patient. Results were also provided in writing at the conclusion of the visit. If applicable, a reminder letter will be sent to the patient regarding the next appointment. BI-RADS CATEGORY  2: Benign. Electronically Signed   By: Margarette Canada M.D.   On: 03/26/2018 14:23   Mm Diag Breast W/implant Tomo Uni L  Result Date: 03/26/2018 CLINICAL DATA:  48 year old female for 3 months follow-up of probable LEFT breast fat necrosis. History of LEFT mammoplasty and augmentation. EXAM: DIGITAL DIAGNOSTIC LEFT MAMMOGRAM WITH IMPLANTS, CAD AND TOMO ULTRASOUND LEFT BREAST The patient has a retroglandular implant. Standard and implant displaced views were performed. COMPARISON:  Previous exam(s). ACR Breast Density Category b: There are scattered areas of fibroglandular density. FINDINGS: 2D/3D full field and spot compression views of the LEFT breast demonstrate no suspicious mass, nonsurgical distortion or worrisome calcifications. Biopsy clip within the UPPER-OUTER LEFT breast again identified. Mammographic images were processed with CAD. Targeted ultrasound is performed, showing a 1.4 x 1 x 1.9 cm hyperechoic area with increased cystic changes at the 6 o'clock position of the LEFT breast 7 cm from the nipple, measuring 1.4 x 1.3 x 2.3 cm on 12/21/2017. This is compatible with improving benign fat necrosis. IMPRESSION: Slightly smaller area of benign fat necrosis within the LOWER LEFT breast. No further imaging follow-up recommended. RECOMMENDATION: Bilateral screening mammogram in 7 months to resume annual mammogram schedule. I have discussed the findings and recommendations with the patient. Results were also provided in writing at the conclusion of the visit. If  applicable, a reminder letter will be sent to the patient regarding the next appointment. BI-RADS CATEGORY  2: Benign. Electronically Signed   By: Margarette Canada M.D.   On: 03/26/2018 14:23     ELIGIBLE FOR AVAILABLE RESEARCH PROTOCOL: no   ASSESSMENT: 48 y.o. [place] woman status post right breast biopsy 12/12/2015 showing atypical lobular hyperplasia, subsequent lumpectomy 01/08/2016 showing no residual ALH  (1) additional risk factors:  (a) first live birth after age 19  (b) breast density category C  (c) unknown family history on mother's side  (2) intensified screening:   (a) Yearly mammography with tomography and biannual breast exam  (b) yearly breast MRI 6 months apart from mammography  (3) risk reduction strategy: Tamoxifen started March 2019  (a) Mirena in place  PLAN: Myalynn is now a year into tamoxifen with very minimal side effects, basically small changes in her hair (which are not noticeable to me).  She does have "active breasts" and has had very close monitoring because of developing fat necrosis.  That now seems to have settled down some.  She will have a breast MRI in September and then she will repeat mammography in February of next year  I have encouraged her to exercise more regularly.  Otherwise the plan is to continue tamoxifen for a total of 5 years  She knows to call for any other issue that may develop before the next visit.    Memorie Yokoyama, Virgie Dad, MD  04/19/18 11:37 AM Medical Oncology and Hematology [facility] [address] [place], [place] [address] Tel. [phone]    Fax. [phone]  I, Jacqualyn Posey am acting as a Education administrator for Chauncey Cruel, MD.   I, Lurline Del MD, have reviewed  the above documentation for accuracy and completeness, and I agree with the above.

## 2018-04-19 ENCOUNTER — Inpatient Hospital Stay: Payer: BLUE CROSS/BLUE SHIELD | Attending: Oncology | Admitting: Oncology

## 2018-04-19 ENCOUNTER — Other Ambulatory Visit: Payer: Self-pay

## 2018-04-19 ENCOUNTER — Telehealth: Payer: Self-pay | Admitting: Oncology

## 2018-04-19 VITALS — BP 120/79 | HR 76 | Temp 98.2°F | Resp 18 | Ht 60.2 in | Wt 172.3 lb

## 2018-04-19 DIAGNOSIS — Z79810 Long term (current) use of selective estrogen receptor modulators (SERMs): Secondary | ICD-10-CM | POA: Diagnosis not present

## 2018-04-19 DIAGNOSIS — Z79899 Other long term (current) drug therapy: Secondary | ICD-10-CM | POA: Diagnosis not present

## 2018-04-19 DIAGNOSIS — E039 Hypothyroidism, unspecified: Secondary | ICD-10-CM | POA: Insufficient documentation

## 2018-04-19 DIAGNOSIS — Z9189 Other specified personal risk factors, not elsewhere classified: Secondary | ICD-10-CM

## 2018-04-19 DIAGNOSIS — Z7982 Long term (current) use of aspirin: Secondary | ICD-10-CM | POA: Diagnosis not present

## 2018-04-19 DIAGNOSIS — I10 Essential (primary) hypertension: Secondary | ICD-10-CM | POA: Diagnosis not present

## 2018-04-19 DIAGNOSIS — Z975 Presence of (intrauterine) contraceptive device: Secondary | ICD-10-CM | POA: Insufficient documentation

## 2018-04-19 DIAGNOSIS — Z793 Long term (current) use of hormonal contraceptives: Secondary | ICD-10-CM | POA: Diagnosis not present

## 2018-04-19 DIAGNOSIS — N6091 Unspecified benign mammary dysplasia of right breast: Secondary | ICD-10-CM | POA: Diagnosis present

## 2018-04-19 MED ORDER — TAMOXIFEN CITRATE 20 MG PO TABS: 20.0000 mg | ORAL_TABLET | Freq: Every day | ORAL | 4 refills | Status: DC

## 2018-09-08 ENCOUNTER — Other Ambulatory Visit: Payer: Self-pay | Admitting: Oncology

## 2018-09-08 DIAGNOSIS — Z1231 Encounter for screening mammogram for malignant neoplasm of breast: Secondary | ICD-10-CM

## 2018-10-26 ENCOUNTER — Ambulatory Visit
Admission: RE | Admit: 2018-10-26 | Discharge: 2018-10-26 | Disposition: A | Discharge status: Home / Self Care | Payer: BC Managed Care – PPO | Source: Ambulatory Visit | Attending: Oncology | Admitting: Oncology

## 2018-10-26 ENCOUNTER — Other Ambulatory Visit: Payer: Self-pay

## 2018-10-26 DIAGNOSIS — Z1231 Encounter for screening mammogram for malignant neoplasm of breast: Secondary | ICD-10-CM

## 2019-04-18 NOTE — Progress Notes (Signed)
No show

## 2019-04-19 ENCOUNTER — Encounter: Payer: Self-pay | Admitting: Oncology

## 2019-04-19 ENCOUNTER — Inpatient Hospital Stay: Payer: BC Managed Care – PPO | Attending: Oncology | Admitting: Oncology

## 2019-05-20 ENCOUNTER — Other Ambulatory Visit: Payer: Self-pay | Admitting: *Deleted

## 2019-05-20 DIAGNOSIS — Z1239 Encounter for other screening for malignant neoplasm of breast: Secondary | ICD-10-CM

## 2019-05-20 DIAGNOSIS — N6091 Unspecified benign mammary dysplasia of right breast: Secondary | ICD-10-CM

## 2019-06-03 ENCOUNTER — Ambulatory Visit
Admission: RE | Admit: 2019-06-03 | Discharge: 2019-06-03 | Disposition: A | Discharge status: Home / Self Care | Payer: BC Managed Care – PPO | Source: Ambulatory Visit | Attending: Oncology | Admitting: Oncology

## 2019-06-03 ENCOUNTER — Other Ambulatory Visit: Payer: Self-pay

## 2019-06-03 DIAGNOSIS — Z1239 Encounter for other screening for malignant neoplasm of breast: Secondary | ICD-10-CM

## 2019-06-03 DIAGNOSIS — N6091 Unspecified benign mammary dysplasia of right breast: Secondary | ICD-10-CM

## 2019-06-03 MED ORDER — GADOBUTROL 1 MMOL/ML IV SOLN
8.0000 mL | Freq: Once | INTRAVENOUS | Status: AC | PRN
  Administered 2019-06-03: 8 mL via INTRAVENOUS

## 2019-06-08 NOTE — Progress Notes (Signed)
[facility]  Telephone:([phone]) [phone] Fax:([phone]) [phone]    ID: Jessica Navarro DOB: September 25, 1970  MR#: [id number]  IZT#:[id number]  Patient Care Team: Silverio Lay, MD as PCP - General (Obstetrics and Gynecology) Claud Kelp, MD as Consulting Physician (General Surgery) Silverio Lay, MD as Consulting Physician (Obstetrics and Gynecology) Cherlyn Roberts, MD as Consulting Physician (Dermatology) Lynann Bologna, MD as Consulting Physician (Internal Medicine) Doyle Askew Emilee Hero, MD as Referring Physician OTHER MD:   CHIEF COMPLAINT: Atypical lobular hyperplasia; breast cancer high risk  CURRENT TREATMENT: Tamoxifen; intensified screening   INTERVAL HISTORY: Jessica Navarro returns today for follow-up of her atypical lobular hyperplasia.   She continues on tamoxifen.  She tolerates this well.  She has had a little bit of warmth but no real hot flashes.  She has no periods as she is on a Mirena IUD.  She has had a little bit of changes in her hair and she takes care of this with conditioner and a creamy shampoo.  Since her last visit, she underwent bilateral screening mammography with tomography at [facility] on 10/26/2018 showing: breast density category B; no evidence of malignancy in either breast.   She also underwent breast MRI on 06/03/2019 showing: breast composition B; 1.6 cm area of non-mass enhancement in inferior central left breast at approximately 6 o'clock and nearby 0.5 cm enhancing mass in surrounding mild non-mass enhancement are probably benign and likely represent fat necrosis; no evidence of malignancy in the right breast or either axilla.  Repeat MRI in 6 months was suggested   REVIEW OF SYSTEMS: Jessica Navarro is not planning to receive the COVID-19 vaccine.  Her husband also has not been vaccinated.  She wonders if she should have some hormonal treatments and tells me she will have her hormones checked.  She wonders however if this might increase her  breast cancer risk.  Aside from that work family and life in general are going well for Jessica Navarro.   HISTORY OF CURRENT ILLNESS: From the original intake note:  The patient had bilateral screening mammography 11/28/2015 showing new calcifications in both breasts.  She was set up for bilateral diagnostic mammography at the breast center 12/10/2015.  This found the breast density to be category C.  There were multiple areas of scattered microcalcifications in both breasts.  The largest individual group in the right breast measured 0.5 cm in the posterior upper outer quadrant.  In the left breast the largest group spanned 0.5 cm in the outer left breast.  In general these were all felt to be most likely benign and six-month follow-up versus biopsy was suggested.    The patient opted for biopsy, which was performed 12/12/2015, and showed ([id number]) in the left breast, only fibrocystic changes with calcifications.  In the right breast however there was atypical lobular hyperplasia.  The patient was referred to surgery and after appropriate discussion with Dr. Derrell Lolling she proceeded to right lumpectomy on 01/08/2016.  The final pathology here ([id number]) showed only fibrocystic changes.  Note that the biopsy site was present and the biopsy clip was identified.  The patient's subsequent history is as detailed below.   PAST MEDICAL HISTORY: Past Medical History:  Diagnosis Date  . Abnormal pap   . Allergy   . Anxiety   . Arthritis    back  . Asthma    as a child  . Atypical lobular hyperplasia [facility]) of right breast 01/08/2016  . Bipolar disorder (HCC)   . Endometriosis   .  Headache    migraines  . HSV-2 (herpes simplex virus 2) infection   . Hypertension   . Hypothyroidism   . Infertility, female   . Thyroid disease    hypothyroidism    PAST SURGICAL HISTORY: Past Surgical History:  Procedure Laterality Date  . AUGMENTATION MAMMAPLASTY Bilateral    silicone on top of muscle  .  BREAST BIOPSY Left    benign  . BREAST EXCISIONAL BIOPSY Right   . BREAST LUMPECTOMY WITH RADIOACTIVE SEED LOCALIZATION Right 01/08/2016   Procedure: RIGHT BREAST LUMPECTOMY WITH RADIOACTIVE SEED LOCALIZATION;  Surgeon: Claud Kelp, MD;  Location: [facility] OR;  Service: General;  Laterality: Right;  . CESAREAN SECTION  2007  . COLONOSCOPY    . foot surgerey    . SINUSOTOMY      FAMILY HISTORY Family History  Problem Relation Age of Onset  . Fibromyalgia Mother   . Alzheimer's disease Paternal Grandmother   . Stomach cancer Paternal Grandfather 67  . Breast cancer Neg Hx    The patient's mother was adopted.  She does not know her biological family.  She is 49 years old as of January 2019.  The patient's father is 40 year old as of January 2019.  Patient has 1 brother, no sisters.  There is no history of breast or ovarian cancer in the family to the patient's knowledge.   GYNECOLOGIC HISTORY:  No LMP recorded. (Menstrual status: IUD). 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  Their twins are Jessica Navarro and Jessica Navarro, [name] as of January 2019.  The patient attends a local [employer]    ADVANCED DIRECTIVES: Not in place   HEALTH MAINTENANCE: Social History   Tobacco Use  . Smoking status: Never Smoker  . Smokeless tobacco: Never Used  Substance Use Topics  . Alcohol use: Yes    Alcohol/week: 1.0 - 2.0 standard drinks    Types: 1 - 2 Standard drinks or equivalent per week    Comment: occasional, 1-2 times a week, cocktail   . Drug use: No     Colonoscopy: Age 61/ [facility]pta  PAP: UTD/ [facility]  Bone density:   Allergies  Allergen Reactions  . Sulfa Antibiotics Nausea And Vomiting    Current Outpatient Medications  Medication Sig Dispense Refill  .  ALPRAZolam (XANAX) 1 MG tablet Take 1 mg by mouth daily as needed (for anxiety).     . Aspirin-Salicylamide-Caffeine (BC HEADACHE POWDER PO) Take 1 packet by mouth daily as needed (for mild headaches.).    Marland Kitchen butalbital-acetaminophen-caffeine (FIORICET, ESGIC) 50-325-40 MG per tablet Take 1-2 tablets by mouth every 6 (six) hours as needed (for migraine headaches.).     Marland Kitchen carvedilol (COREG) 12.5 MG tablet Take 12.5 mg by mouth 2 (two) times daily.  4  . hydrochlorothiazide (HYDRODIURIL) 25 MG tablet Take 25 mg by mouth daily.  4  . levonorgestrel (MIRENA) 20 MCG/24HR IUD 1 each by Intrauterine route once.    Marland Kitchen levothyroxine (SYNTHROID, LEVOTHROID) 50 MCG tablet Take 50 mcg by mouth daily before breakfast.     . OnabotulinumtoxinA (BOTOX IJ) Inject as directed. Gets botox injections every 3 months for migraines    . promethazine (PHENERGAN) 25 MG tablet Take 25 mg by mouth every 4 (four) hours as needed (for migraine induced  nausea).     . rizatriptan (MAXALT) 10 MG tablet Take 10 mg by mouth daily as needed (for migraine headaches.).     Marland Kitchen tamoxifen (NOLVADEX) 20 MG tablet Take 1 tablet (20 mg total) by mouth daily. 90 tablet 4  . valACYclovir (VALTREX) 500 MG tablet Take 500 mg by mouth daily as needed. For cold sores.  0  . vortioxetine HBr (TRINTELLIX) 10 MG TABS Take 10 mg by mouth at bedtime.     No current facility-administered medications for this visit.    OBJECTIVE: white woman in no acute distress  Vitals:   06/09/19 0907  BP: (!) 103/52  Pulse: 78  Resp: 20  Temp: 98 F (36.7 C)  SpO2: 100%     Body mass index is 34.82 kg/m.   Wt Readings from Last 3 Encounters:  06/09/19 179 lb 8 oz (81.4 kg)  04/19/18 172 lb 4.8 oz (78.2 kg)  06/17/17 166 lb 3.2 oz (75.4 kg)      ECOG FS:0 - Asymptomatic  Sclerae unicteric, EOMs intact Wearing a mask No cervical or supraclavicular adenopathy Lungs no rales or rhonchi Heart regular rate and rhythm Abd soft, nontender, positive  bowel sounds MSK no focal spinal tenderness, no upper extremity lymphedema Neuro: nonfocal, well oriented, appropriate affect Breasts: She has bilateral prepectoral implants.  The right breast is status post biopsy.  There are no suspicious findings.  There are no skin or nipple changes of concern.  She was concerned about an area in the inferior aspect of the left breast towards the middle.  That is exactly where the reduction mammoplasty scars are.  I do not feel anything unusual in that area.  Both axillae are benign.  LAB RESULTS:  CMP     Component Value Date/Time   NA 138 02/26/2016 1218   K 3.4 (L) 02/26/2016 1218   CL 101 01/08/2016 0937   CO2 28 02/26/2016 1218   GLUCOSE 91 02/26/2016 1218   BUN 18.6 02/26/2016 1218   CREATININE 0.7 02/26/2016 1218   CALCIUM 9.4 02/26/2016 1218   PROT 7.4 02/26/2016 1218   ALBUMIN 4.1 02/26/2016 1218   AST 20 02/26/2016 1218   ALT 32 02/26/2016 1218   ALKPHOS 85 02/26/2016 1218   BILITOT 0.49 02/26/2016 1218   GFRNONAA >60 01/08/2016 0937   GFRAA >60 01/08/2016 0937    No results found for: TOTALPROTELP, ALBUMINELP, A1GS, A2GS, BETS, BETA2SER, GAMS, MSPIKE, SPEI  No results found for: KPAFRELGTCHN, LAMBDASER, KAPLAMBRATIO  Lab Results  Component Value Date   WBC 6.1 01/08/2016   HGB 13.5 01/08/2016   HCT 40.2 01/08/2016   MCV 93.1 01/08/2016   PLT 250 01/08/2016   No results found for: LABCA2  No components found for: XVQMGQ676  No results for input(s): INR in the last 168 hours.  No results found for: LABCA2  No results found for: PPJ093  No results found for: OIZ124  No results found for: PYK998  No results found for: CA2729  No components found for: HGQUANT  No results found for: CEA1 / No results found for: CEA1   No results found for: AFPTUMOR  No results found for: CHROMOGRNA  No results found for: HGBA, HGBA2QUANT, HGBFQUANT, HGBSQUAN (Hemoglobinopathy evaluation)   No results found for: LDH  No  results found for: IRON, TIBC, IRONPCTSAT (Iron and TIBC)  No results found for: FERRITIN  Urinalysis No results found for: COLORURINE, APPEARANCEUR, LABSPEC, PHURINE, GLUCOSEU, HGBUR, BILIRUBINUR, KETONESUR, [place], [place], NITRITE, LEUKOCYTESUR  STUDIES: MR BREAST BILATERAL W WO CONTRAST INC CAD  Result Date: 06/06/2019 CLINICAL DATA:  49 year old female with history of ALH in 2017 status post lumpectomy. Bilateral implants placed in 2018. No current problems. LABS:  None. EXAM: BILATERAL BREAST MRI WITH AND WITHOUT CONTRAST TECHNIQUE: Multiplanar, multisequence MR images of both breasts were obtained prior to and following the intravenous administration of 8 ml of Gadavist Three-dimensional MR images were rendered by post-processing of the original MR data on an independent workstation. The three-dimensional MR images were interpreted, and findings are reported in the following complete MRI report for this study. Three dimensional images were evaluated at the independent DynaCad workstation COMPARISON:  Previous exam(s). FINDINGS: Breast composition: b. Scattered fibroglandular tissue. Background parenchymal enhancement: Mild There are bilateral prepectoral silicone implants. Right breast: No mass or abnormal enhancement. Left breast: In the inferior central left breast at approximately 6 o'clock there is a focal area of non mass enhancement measuring 1.6 cm which appear to contain internal fat and demonstrates benign kinetics (series 6, image 125). Just medial and posterior to this there is a small enhancing mass measuring 0.5 cm with surrounding mild non mass enhancement (series 6, image 122). This also appears to contain some internal fat and demonstrates benign kinetics. There are no other areas of abnormal enhancement in the left breast. Lymph nodes: No abnormal appearing lymph nodes. Ancillary findings:  None. IMPRESSION: 1. There is a 1.6 cm area of non mass enhancement in the  inferior central left breast at approximately 6 o'clock and a nearby 0.5 cm enhancing mass in the surrounding mild non mass enhancement. These findings are probably benign and likely represent fat necrosis. The patient had documented fat necrosis on mammogram and ultrasound in the inferior left breast in February 2020. 2. No MRI evidence of malignancy in the right breast. RECOMMENDATION: Bilateral breast MRI in 6 months. BI-RADS CATEGORY  3: Probably benign. Electronically Signed   By: Emmaline KluverNancy  Ballantyne M.D.   On: 06/06/2019 08:11     ELIGIBLE FOR AVAILABLE RESEARCH PROTOCOL: no   ASSESSMENT: 49 [place] status post right breast biopsy 12/12/2015 showing atypical lobular hyperplasia, subsequent lumpectomy 01/08/2016 showing no residual ALH  (1) additional risk factors:  (a) first live birth after age 49  (b) breast density category C  (c) unknown family history on mother's side  (2) intensified screening:   (a) Yearly mammography with tomography and biannual breast exam  (b) yearly breast MRI 6 months apart from mammography  (3) risk reduction strategy: Tamoxifen started March 2019  (a) Mirena in place   PLAN: Jessica Navarro is now a little 2 years into her 5 years of tamoxifen and generally she is tolerating this remarkably well.  The plan is for a total of 5 years for optimal risk reduction.  Her most recent breast MRI showed what most likely is fat necrosis.  It would be prudent to proceed to breast MRI in 6 months to confirm there has been no suspicious change.  Accordingly we are not proceeding with mammography in September as originally planned but will do an MRI.  The 1 concern of course is cost.  So far this has been manageable but she will let me know if it is not.  We will have a virtual visit sometime in October and then I will see her again after her next study which will then likely be mammography in May of next year  She knows to call for any other issue  that may develop before then  Total encounter time 25 minutes.*   Norleen Xie, Valentino Hue, MD  06/09/19 10:18 AM Medical Oncology and Hematology [facility] [address] [place], [place] [address] Tel. [phone]    Fax. [phone]   I, Mickie Bail, am acting as scribe for Dr. Valentino Hue. Makylie Rivere.  Felicita Gage MD, have reviewed the above documentation for accuracy and completeness, and I agree with the above. t}   *Total Encounter Time as defined by the Centers for Medicare and Medicaid Services includes, in addition to the face-to-face time of a patient visit (documented in the note above) non-face-to-face time: obtaining and reviewing outside history, ordering and reviewing medications, tests or procedures, care coordination (communications with other health care professionals or caregivers) and documentation in the medical record.

## 2019-06-09 ENCOUNTER — Inpatient Hospital Stay: Payer: BC Managed Care – PPO | Attending: Oncology | Admitting: Oncology

## 2019-06-09 ENCOUNTER — Other Ambulatory Visit: Payer: Self-pay

## 2019-06-09 VITALS — BP 103/52 | HR 78 | Temp 98.0°F | Resp 20 | Ht 60.2 in | Wt 179.5 lb

## 2019-06-09 DIAGNOSIS — E039 Hypothyroidism, unspecified: Secondary | ICD-10-CM | POA: Insufficient documentation

## 2019-06-09 DIAGNOSIS — Z79810 Long term (current) use of selective estrogen receptor modulators (SERMs): Secondary | ICD-10-CM | POA: Insufficient documentation

## 2019-06-09 DIAGNOSIS — Z9189 Other specified personal risk factors, not elsewhere classified: Secondary | ICD-10-CM | POA: Diagnosis not present

## 2019-06-09 DIAGNOSIS — Z793 Long term (current) use of hormonal contraceptives: Secondary | ICD-10-CM | POA: Diagnosis not present

## 2019-06-09 DIAGNOSIS — Z79899 Other long term (current) drug therapy: Secondary | ICD-10-CM | POA: Insufficient documentation

## 2019-06-09 DIAGNOSIS — N62 Hypertrophy of breast: Secondary | ICD-10-CM | POA: Diagnosis present

## 2019-06-09 DIAGNOSIS — N6091 Unspecified benign mammary dysplasia of right breast: Secondary | ICD-10-CM

## 2019-06-09 DIAGNOSIS — I10 Essential (primary) hypertension: Secondary | ICD-10-CM | POA: Insufficient documentation

## 2019-06-09 DIAGNOSIS — Z1239 Encounter for other screening for malignant neoplasm of breast: Secondary | ICD-10-CM | POA: Diagnosis not present

## 2019-06-10 ENCOUNTER — Telehealth: Payer: Self-pay | Admitting: Oncology

## 2019-06-10 NOTE — Telephone Encounter (Signed)
Scheduled appts per 5/13 los. Left voicemail with appt date and time.

## 2019-06-21 ENCOUNTER — Other Ambulatory Visit: Payer: Self-pay | Admitting: Oncology

## 2019-09-23 ENCOUNTER — Other Ambulatory Visit: Payer: Self-pay | Admitting: *Deleted

## 2019-09-23 ENCOUNTER — Telehealth: Payer: Self-pay | Admitting: *Deleted

## 2019-09-23 NOTE — Telephone Encounter (Signed)
VM left by pt stating she believes she is to be scheduled for an MRI not in September but November- " six months from my last "  Last MRI was May 2021.  Noted MRI not PA yet- this note will be sent to PA dept.  To clarify- MRI of breast is needed due to noted abnormal finding on prior and need to re-evaluate in high risk patient in November 2021.

## 2019-10-30 NOTE — Progress Notes (Signed)
[facility]  [phone] [phone]    ID: Jessica Navarro DOB: Feb 01, 1970  MR#: [id number]  IAX#:[id number]  Patient Care Team: Silverio Lay, MD as PCP - General (Obstetrics and Gynecology) Claud Kelp, MD as Consulting Physician (General Surgery) Silverio Lay, MD as Consulting Physician (Obstetrics and Gynecology) Cherlyn Roberts, MD as Consulting Physician (Dermatology) Lynann Bologna, MD as Consulting Physician (Internal Medicine) Doyle Askew Emilee Hero, MD as Referring Physician OTHER MD:  I was unable to connect with Jessica Navarro on 10/31/19 at  3:00 PM EDT by telephone visit.  I left a message that her virtual visit with me will be rescheduled after her upcoming MRI    CHIEF COMPLAINT: Atypical lobular hyperplasia; breast cancer high risk  CURRENT TREATMENT: Tamoxifen; intensified screening   INTERVAL HISTORY:    Since her last visit, she has not undergone any additional studies. She is overdue for mammography, which was last performed on 10/26/2018 at [facility].   She is scheduled for breast MRI on 12/09/2019. Her most recent MRI was performed on 06/03/2019.   REVIEW OF SYSTEMS: Jessica Navarro    HISTORY OF CURRENT ILLNESS: From the original intake note:  The patient had bilateral screening mammography 11/28/2015 showing new calcifications in both breasts.  She was set up for bilateral diagnostic mammography at the breast center 12/10/2015.  This found the breast density to be category C.  There were multiple areas of scattered microcalcifications in both breasts.  The largest individual group in the right breast measured 0.5 cm in the posterior upper outer quadrant.  In the left breast the largest group spanned 0.5 cm in the outer left breast.  In general these were all felt to be most likely benign and six-month follow-up versus biopsy was suggested.    The patient opted for biopsy, which was performed 12/12/2015,  and showed ([id number]) in the left breast, only fibrocystic changes with calcifications.  In the right breast however there was atypical lobular hyperplasia.  The patient was referred to surgery and after appropriate discussion with Dr. Derrell Lolling she proceeded to right lumpectomy on 01/08/2016.  The final pathology here ([id number]) showed only fibrocystic changes.  Note that the biopsy site was present and the biopsy clip was identified.  The patient's subsequent history is as detailed below.   PAST MEDICAL HISTORY: Past Medical History:  Diagnosis Date  . Abnormal pap   . Allergy   . Anxiety   . Arthritis    back  . Asthma    as a child  . Atypical lobular hyperplasia [facility]) of right breast 01/08/2016  . Bipolar disorder (HCC)   . Endometriosis   . Headache    migraines  . HSV-2 (herpes simplex virus 2) infection   . Hypertension   . Hypothyroidism   . Infertility, female   . Thyroid disease    hypothyroidism    PAST SURGICAL HISTORY: Past Surgical History:  Procedure Laterality Date  . AUGMENTATION MAMMAPLASTY Bilateral    silicone on top of muscle  . BREAST BIOPSY Left    benign  . BREAST EXCISIONAL BIOPSY Right   . BREAST LUMPECTOMY WITH RADIOACTIVE SEED LOCALIZATION Right 01/08/2016   Procedure: RIGHT BREAST LUMPECTOMY WITH RADIOACTIVE SEED LOCALIZATION;  Surgeon: Claud Kelp, MD;  Location: [facility] OR;  Service: General;  Laterality: Right;  . CESAREAN SECTION  2007  . COLONOSCOPY    . foot surgerey    . SINUSOTOMY      FAMILY HISTORY Family History  Problem Relation Age of Onset  . Fibromyalgia Mother   . Alzheimer's disease Paternal Grandmother   . Stomach cancer Paternal Grandfather 65  . Breast cancer Neg Hx    The patient's mother was adopted.  She does not know her biological family.  She is 49 years old as of January 2019.  The patient's father is 64 year old as of January 2019.  Patient has 1 brother, no sisters.  There is no history of breast or  ovarian cancer in the family to the patient's knowledge.   GYNECOLOGIC HISTORY:  No LMP recorded. (Menstrual status: IUD). 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  Their twins are Jessica Navarro and Jessica Navarro, [name] as of January 2019.  The patient attends a local [employer]    ADVANCED DIRECTIVES: Not in place   HEALTH MAINTENANCE: Social History   Tobacco Use  . Smoking status: Never Smoker  . Smokeless tobacco: Never Used  Substance Use Topics  . Alcohol use: Yes    Alcohol/week: 1.0 - 2.0 standard drink    Types: 1 - 2 Standard drinks or equivalent per week    Comment: occasional, 1-2 times a week, cocktail   . Drug use: No     Colonoscopy: Age 66/ Gupta  PAP: UTD/ Rivard  Bone density:   Allergies  Allergen Reactions  . Sulfa Antibiotics Nausea And Vomiting    Current Outpatient Medications  Medication Sig Dispense Refill  . ALPRAZolam (XANAX) 1 MG tablet Take 1 mg by mouth daily as needed (for anxiety).     . Aspirin-Salicylamide-Caffeine (BC HEADACHE POWDER PO) Take 1 packet by mouth daily as needed (for mild headaches.).    Marland Kitchen butalbital-acetaminophen-caffeine (FIORICET, ESGIC) 50-325-40 MG per tablet Take 1-2 tablets by mouth every 6 (six) hours as needed (for migraine headaches.).     Marland Kitchen carvedilol (COREG) 12.5 MG tablet Take 12.5 mg by mouth 2 (two) times daily.  4  . hydrochlorothiazide (HYDRODIURIL) 25 MG tablet Take 25 mg by mouth daily.  4  . levonorgestrel (MIRENA) 20 MCG/24HR IUD 1 each by Intrauterine route once.    Marland Kitchen levothyroxine (SYNTHROID, LEVOTHROID) 50 MCG tablet Take 50 mcg by mouth daily before breakfast.     . OnabotulinumtoxinA (BOTOX IJ) Inject as directed. Gets botox injections every 3 months for migraines    .  promethazine (PHENERGAN) 25 MG tablet Take 25 mg by mouth every 4 (four) hours as needed (for migraine induced nausea).     . rizatriptan (MAXALT) 10 MG tablet Take 10 mg by mouth daily as needed (for migraine headaches.).     Marland Kitchen tamoxifen (NOLVADEX) 20 MG tablet TAKE 1 TABLET BY MOUTH ONCE DAILY. 90 tablet 4  . valACYclovir (VALTREX) 500 MG tablet Take 500 mg by mouth daily as needed. For cold sores.  0  . vortioxetine HBr (TRINTELLIX) 10 MG TABS Take 10 mg by mouth at bedtime.     No current facility-administered medications for this visit.    OBJECTIVE:   There were no vitals filed for this visit.   There is no height or weight on file to calculate BMI.   Wt Readings from Last 3 Encounters:  06/09/19 179 lb 8 oz (81.4 kg)  04/19/18 172 lb 4.8 oz (78.2 kg)  06/17/17 166  lb 3.2 oz (75.4 kg)      ECOG FS:0 - Asymptomatic  Telemedicine visit 10/31/2019    LAB RESULTS:  CMP     Component Value Date/Time   NA 138 02/26/2016 1218   K 3.4 (L) 02/26/2016 1218   CL 101 01/08/2016 0937   CO2 28 02/26/2016 1218   GLUCOSE 91 02/26/2016 1218   BUN 18.6 02/26/2016 1218   CREATININE 0.7 02/26/2016 1218   CALCIUM 9.4 02/26/2016 1218   PROT 7.4 02/26/2016 1218   ALBUMIN 4.1 02/26/2016 1218   AST 20 02/26/2016 1218   ALT 32 02/26/2016 1218   ALKPHOS 85 02/26/2016 1218   BILITOT 0.49 02/26/2016 1218   GFRNONAA >60 01/08/2016 0937   GFRAA >60 01/08/2016 0937    No results found for: TOTALPROTELP, ALBUMINELP, A1GS, A2GS, BETS, BETA2SER, GAMS, MSPIKE, SPEI  No results found for: KPAFRELGTCHN, LAMBDASER, KAPLAMBRATIO  Lab Results  Component Value Date   WBC 6.1 01/08/2016   HGB 13.5 01/08/2016   HCT 40.2 01/08/2016   MCV 93.1 01/08/2016   PLT 250 01/08/2016   No results found for: LABCA2  No components found for: [id number]  No results for input(s): INR in the last 168 hours.  No results found for: LABCA2  No results found for: HYQ657  No results found for: QIO962  No  results found for: XBM841  No results found for: CA2729  No components found for: HGQUANT  No results found for: CEA1 / No results found for: CEA1   No results found for: AFPTUMOR  No results found for: CHROMOGRNA  No results found for: HGBA, HGBA2QUANT, HGBFQUANT, HGBSQUAN (Hemoglobinopathy evaluation)   No results found for: LDH  No results found for: IRON, TIBC, IRONPCTSAT (Iron and TIBC)  No results found for: FERRITIN  Urinalysis No results found for: COLORURINE, APPEARANCEUR, LABSPEC, PHURINE, GLUCOSEU, HGBUR, BILIRUBINUR, KETONESUR, PROTEINUR, UROBILINOGEN, NITRITE, LEUKOCYTESUR   STUDIES: No results found.    ELIGIBLE FOR AVAILABLE RESEARCH PROTOCOL: no   ASSESSMENT: 49 y.o. Jessica Navarro woman status post right breast biopsy 12/12/2015 showing atypical lobular hyperplasia, subsequent lumpectomy 01/08/2016 showing no residual ALH  (1) additional risk factors:  (a) first live birth after age 67  (b) breast density category C  (c) unknown family history on mother's side  (2) intensified screening:   (a) Yearly mammography with tomography and biannual breast exam  (b) yearly breast MRI 6 months apart from mammography  (3) risk reduction strategy: Tamoxifen started March 2019  (a) Mirena in place   PLAN: Unable to contact patient for scheduled telemedicine visit 10/31/2019.  Visit is being rescheduled   Jessica Navarro, Valentino Hue, MD  10/31/19 3:31 PM Medical Oncology and Hematology [facility] [address] [place], [place] [address] Tel. [phone]    Fax. [phone]   I, Mickie Bail, am acting as scribe for Dr. Valentino Hue. Jessica Navarro.  I, Ruthann Cancer MD, have reviewed the above documentation for accuracy and completeness, and I agree with the above.   *Total Encounter Time as defined by the Centers for Medicare and Medicaid Services includes, in addition to the face-to-face time of a patient visit (documented in the  note above) non-face-to-face time: obtaining and reviewing outside history, ordering and reviewing medications, tests or procedures, care coordination (communications with other health care professionals or caregivers) and documentation in the medical record.

## 2019-10-31 ENCOUNTER — Inpatient Hospital Stay: Payer: BC Managed Care – PPO | Attending: Oncology | Admitting: Oncology

## 2019-11-02 ENCOUNTER — Telehealth: Payer: Self-pay | Admitting: Oncology

## 2019-11-02 NOTE — Telephone Encounter (Signed)
Scheduled apt per 10/4 LOS - mailed reminder letter with appt date and time

## 2019-12-09 ENCOUNTER — Ambulatory Visit
Admission: RE | Admit: 2019-12-09 | Discharge: 2019-12-09 | Disposition: A | Discharge status: Home / Self Care | Payer: BC Managed Care – PPO | Source: Ambulatory Visit | Attending: Oncology | Admitting: Oncology

## 2019-12-09 ENCOUNTER — Other Ambulatory Visit: Payer: Self-pay

## 2019-12-09 DIAGNOSIS — N6091 Unspecified benign mammary dysplasia of right breast: Secondary | ICD-10-CM

## 2019-12-09 DIAGNOSIS — Z1239 Encounter for other screening for malignant neoplasm of breast: Secondary | ICD-10-CM

## 2019-12-09 DIAGNOSIS — Z9189 Other specified personal risk factors, not elsewhere classified: Secondary | ICD-10-CM

## 2019-12-09 MED ORDER — GADOBUTROL 1 MMOL/ML IV SOLN
8.0000 mL | Freq: Once | INTRAVENOUS | Status: AC | PRN
  Administered 2019-12-09: 8 mL via INTRAVENOUS

## 2019-12-11 NOTE — Progress Notes (Signed)
[facility]  Telephone:([phone] Fax:(336) [phone]    ID: Jessica Navarro DOB: [birth year]  MR#: [id number]  HWE#:[id number]  Patient Care Team: Silverio Lay, MD as PCP - General (Obstetrics and Gynecology) Claud Kelp, MD as Consulting Physician (General Surgery) Silverio Lay, MD as Consulting Physician (Obstetrics and Gynecology) Cherlyn Roberts, MD as Consulting Physician (Dermatology) Lynann Bologna, MD as Consulting Physician (Internal Medicine) Doyle Askew Emilee Hero, MD as Referring Physician OTHER MD:  I connected with Jessica Navarro on 12/12/19 at 12:15 PM EST by video enabled telemedicine visit and verified that I am speaking with the correct person using two identifiers.   I discussed the limitations, risks, security and privacy concerns of performing an evaluation and management service by telemedicine and the availability of in-person appointments. I also discussed with the patient that there may be a patient responsible charge related to this service. The patient expressed understanding and agreed to proceed.   Other persons participating in the visit and their role in the encounter:none  Patient's location: home Provider's location: [facility]    CHIEF COMPLAINT: Atypical lobular hyperplasia; breast cancer high risk  CURRENT TREATMENT: Tamoxifen; intensified screening   INTERVAL HISTORY: Jessica Navarro was contacted today for follow up of her high risk for breast cancer.  She continues on tamoxifen with good tolerance.  She is not having issues with hot flashes or vaginal discharge.  Recall she has an IUD in place  Since her last visit, she underwent breast MRI on 12/09/2019 showing: breast composition B; two adjacent areas of non-mass enhancement within anterior and slightly inferior medial left breast which may represent areas of fat necrosis.  This suggested diagnostic mammography and ultrasonography for further  evaluation   REVIEW OF SYSTEMS: Jessica Navarro generally is doing fine.  She gets on her treadmill at least twice a week at least 30 minutes at a time.  A detailed review of systems today was otherwise noncontributory   COVID 19 VACCINATION STATUS: Not vaccinated as of 12/12/2019   HISTORY OF CURRENT ILLNESS: From the original intake note:  The patient had bilateral screening mammography 11/28/2015 showing new calcifications in both breasts.  She was set up for bilateral diagnostic mammography at the breast center 12/10/2015.  This found the breast density to be category C.  There were multiple areas of scattered microcalcifications in both breasts.  The largest individual group in the right breast measured 0.5 cm in the posterior upper outer quadrant.  In the left breast the largest group spanned 0.5 cm in the outer left breast.  In general these were all felt to be most likely benign and six-month follow-up versus biopsy was suggested.    The patient opted for biopsy, which was performed 12/12/2015, and showed ([id number]) in the left breast, only fibrocystic changes with calcifications.  In the right breast however there was atypical lobular hyperplasia.  The patient was referred to surgery and after appropriate discussion with Dr. Derrell Lolling she proceeded to right lumpectomy on 01/08/2016.  The final pathology here ([id number]) showed only fibrocystic changes.  Note that the biopsy site was present and the biopsy clip was identified.  The patient's subsequent history is as detailed below.   PAST MEDICAL HISTORY: Past Medical History:  Diagnosis Date  . Abnormal pap   . Allergy   . Anxiety   . Arthritis    back  . Asthma    as a child  . Atypical lobular hyperplasia [facility]) of right breast 01/08/2016  .  Bipolar disorder (HCC)   . Endometriosis   . Headache    migraines  . HSV-2 (herpes simplex virus 2) infection   . Hypertension   . Hypothyroidism   . Infertility, female   . Thyroid  disease    hypothyroidism    PAST SURGICAL HISTORY: Past Surgical History:  Procedure Laterality Date  . AUGMENTATION MAMMAPLASTY Bilateral    silicone on top of muscle  . BREAST BIOPSY Left    benign  . BREAST EXCISIONAL BIOPSY Right   . BREAST LUMPECTOMY WITH RADIOACTIVE SEED LOCALIZATION Right 01/08/2016   Procedure: RIGHT BREAST LUMPECTOMY WITH RADIOACTIVE SEED LOCALIZATION;  Surgeon: Claud Kelp, MD;  Location: [facility] OR;  Service: General;  Laterality: Right;  . CESAREAN SECTION  2007  . COLONOSCOPY    . foot surgerey    . SINUSOTOMY      FAMILY HISTORY Family History  Problem Relation Age of Onset  . Fibromyalgia Mother   . Alzheimer's disease Paternal Grandmother   . Stomach cancer Paternal Grandfather 3  . Breast cancer Neg Hx    The patient's mother was adopted.  She does not know her biological family.  She is 49 years old as of January 2019.  The patient's father is 22 year old as of January 2019.  Patient has 1 brother, no sisters.  There is no history of breast or ovarian cancer in the family to the patient's knowledge.   GYNECOLOGIC HISTORY:  No LMP recorded. (Menstrual status: IUD). 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  Their twins are Jessica Navarro and Jessica Navarro, [name] as of January 2019.  The patient attends a local [employer]    ADVANCED DIRECTIVES: Not in place   HEALTH MAINTENANCE: Social History   Tobacco Use  . Smoking status: Never Smoker  . Smokeless tobacco: Never Used  Substance Use Topics  . Alcohol use: Yes    Alcohol/week: 1.0 - 2.0 standard drink    Types: 1 - 2 Standard drinks or equivalent per week    Comment: occasional, 1-2 times a week, cocktail   . Drug use: No     Colonoscopy: Age 29/ Gupta  PAP:  UTD/ Rivard  Bone density:   Allergies  Allergen Reactions  . Sulfa Antibiotics Nausea And Vomiting    Current Outpatient Medications  Medication Sig Dispense Refill  . ALPRAZolam (XANAX) 1 MG tablet Take 1 mg by mouth daily as needed (for anxiety).     . Aspirin-Salicylamide-Caffeine (BC HEADACHE POWDER PO) Take 1 packet by mouth daily as needed (for mild headaches.).    Marland Kitchen butalbital-acetaminophen-caffeine (FIORICET, ESGIC) 50-325-40 MG per tablet Take 1-2 tablets by mouth every 6 (six) hours as needed (for migraine headaches.).     Marland Kitchen carvedilol (COREG) 12.5 MG tablet Take 12.5 mg by mouth 2 (two) times daily.  4  . hydrochlorothiazide (HYDRODIURIL) 25 MG tablet Take 25 mg by mouth daily.  4  . levonorgestrel (MIRENA) 20 MCG/24HR IUD 1 each by Intrauterine route once.    Marland Kitchen levothyroxine (SYNTHROID, LEVOTHROID) 50 MCG tablet Take 50 mcg by mouth daily before breakfast.     . OnabotulinumtoxinA (BOTOX IJ) Inject as directed. Gets botox injections every 3 months for migraines    . promethazine (PHENERGAN) 25 MG tablet Take 25 mg by  mouth every 4 (four) hours as needed (for migraine induced nausea).     . rizatriptan (MAXALT) 10 MG tablet Take 10 mg by mouth daily as needed (for migraine headaches.).     Marland Kitchen tamoxifen (NOLVADEX) 20 MG tablet TAKE 1 TABLET BY MOUTH ONCE DAILY. 90 tablet 4  . valACYclovir (VALTREX) 500 MG tablet Take 500 mg by mouth daily as needed. For cold sores.  0  . vortioxetine HBr (TRINTELLIX) 10 MG TABS Take 10 mg by mouth at bedtime.     No current facility-administered medications for this visit.    OBJECTIVE:   There were no vitals filed for this visit.   There is no height or weight on file to calculate BMI.   Wt Readings from Last 3 Encounters:  06/09/19 179 lb 8 oz (81.4 kg)  04/19/18 172 lb 4.8 oz (78.2 kg)  06/17/17 166 lb 3.2 oz (75.4 kg)      ECOG FS:1 - Symptomatic but completely ambulatory  Telemedicine visit 12/12/2019   LAB RESULTS:  CMP      Component Value Date/Time   NA 138 02/26/2016 1218   K 3.4 (L) 02/26/2016 1218   CL 101 01/08/2016 0937   CO2 28 02/26/2016 1218   GLUCOSE 91 02/26/2016 1218   BUN 18.6 02/26/2016 1218   CREATININE 0.7 02/26/2016 1218   CALCIUM 9.4 02/26/2016 1218   PROT 7.4 02/26/2016 1218   ALBUMIN 4.1 02/26/2016 1218   AST 20 02/26/2016 1218   ALT 32 02/26/2016 1218   ALKPHOS 85 02/26/2016 1218   BILITOT 0.49 02/26/2016 1218   GFRNONAA >60 01/08/2016 0937   GFRAA >60 01/08/2016 0937    No results found for: TOTALPROTELP, ALBUMINELP, A1GS, A2GS, BETS, BETA2SER, GAMS, MSPIKE, SPEI  No results found for: KPAFRELGTCHN, LAMBDASER, KAPLAMBRATIO  Lab Results  Component Value Date   WBC 6.1 01/08/2016   HGB 13.5 01/08/2016   HCT 40.2 01/08/2016   MCV 93.1 01/08/2016   PLT 250 01/08/2016   No results found for: LABCA2  No components found for: [id number]  No results for input(s): INR in the last 168 hours.  No results found for: LABCA2  No results found for: EPP295  No results found for: JOA416  No results found for: SAY301  No results found for: CA2729  No components found for: HGQUANT  No results found for: CEA1 / No results found for: CEA1   No results found for: AFPTUMOR  No results found for: CHROMOGRNA  No results found for: HGBA, HGBA2QUANT, HGBFQUANT, HGBSQUAN (Hemoglobinopathy evaluation)   No results found for: LDH  No results found for: IRON, TIBC, IRONPCTSAT (Iron and TIBC)  No results found for: FERRITIN  Urinalysis No results found for: COLORURINE, APPEARANCEUR, LABSPEC, PHURINE, GLUCOSEU, HGBUR, BILIRUBINUR, KETONESUR, PROTEINUR, UROBILINOGEN, NITRITE, LEUKOCYTESUR   STUDIES: MR BREAST BILATERAL W WO CONTRAST INC CAD  Result Date: 12/09/2019 CLINICAL DATA:  Patient with prior breast MRI Jun 03, 2019 demonstrating probable fat necrosis within the left breast. Follow-up MRI exam. EXAM: BILATERAL BREAST MRI WITH AND WITHOUT CONTRAST TECHNIQUE:  Multiplanar, multisequence MR images of both breasts were obtained prior to and following the intravenous administration of 8 ml of Gadavist Three-dimensional MR images were rendered by post-processing of the original MR data on an independent workstation. The three-dimensional MR images were interpreted, and findings are reported in the following complete MRI report for this study. Three dimensional images were evaluated at the independent interpreting workstation using the DynaCAD thin client. COMPARISON:  Prior exams  including MRI breast 06/03/2019 FINDINGS: Breast composition: b. Scattered fibroglandular tissue. Background parenchymal enhancement: Minimal Bilateral prepectoral silicone implants. Right breast: No mass or abnormal enhancement. Left breast: Within the inferior central left breast 6 o'clock position slight interval decrease in size of an area of non mass enhancement measuring 1.3 cm (image 132; series 12), previously 1.6 cm. Slightly superior and medial to this is an additional area of non mass enhancement which is similar to mildly increased in size measuring 1.9 cm, previously 1.5 cm (image 123; series 12). There is suggestion of internal fat within both of these areas on T1 sequence. No additional suspicious areas of enhancement identified within the left breast. Lymph nodes: No abnormal appearing lymph nodes. Ancillary findings:  None. IMPRESSION: There are 2 adjacent areas of non mass enhancement within the anterior and slightly inferior medial left breast which may represent areas of fat necrosis. RECOMMENDATION: Recommend diagnostic mammogram and ultrasound of the left breast to assess these two focal areas which are favored to represent fat necrosis. If there are suspicious findings on mammogram or ultrasound in this area, biopsy could be obtained. Otherwise, continue with bilateral MRI follow-up in 6 months. BI-RADS CATEGORY  3: Probably benign. Electronically Signed   By: Annia Beltrew  Davis M.D.    On: 12/09/2019 12:34      ELIGIBLE FOR AVAILABLE RESEARCH PROTOCOL: no   ASSESSMENT: 49 [place] status post right breast biopsy 12/12/2015 showing atypical lobular hyperplasia, subsequent lumpectomy 01/08/2016 showing no residual ALH  (1) additional risk factors:  (a) first live birth after age 49  (b) breast density category C  (c) unknown family history on mother's side  (2) intensified screening:   (a) Yearly mammography with tomography and biannual breast exam  (b) yearly breast MRI 6 months apart from mammography  (3) risk reduction strategy: Tamoxifen started March 2019  (a) Mirena in place   PLAN: Jessica Navarro is doing generally quite well.  The plan is to continue tamoxifen a total of 5 years.  We need to find out if we are dealing with fat necrosis or perhaps a different kind of change.  She is agreeable to right diagnostic mammography and ultrasonography and I have put in that order.  Likely we will talk again once we get those results but otherwise I am setting her up for an in person visit here in 1 year  She knows to call for any other issue that may develop before the next visit.   Renae Mottley, Valentino HueGustav C, MD  12/12/19 12:27 PM Medical Oncology and Hematology [facility] [address] [place], [place] [address] Tel. [phone]    Fax. [phone]   I, Mickie BailKatie Daubenspeck, am acting as scribe for Dr. Valentino HueGustav C. Kalel Harty.  I, Ruthann CancerGustav Davaris Youtsey MD, have reviewed the above documentation for accuracy and completeness, and I agree with the above.   *Total Encounter Time as defined by the Centers for Medicare and Medicaid Services includes, in addition to the face-to-face time of a patient visit (documented in the note above) non-face-to-face time: obtaining and reviewing outside history, ordering and reviewing medications, tests or procedures, care coordination (communications with other health care professionals or caregivers) and  documentation in the medical record.

## 2019-12-12 ENCOUNTER — Inpatient Hospital Stay: Payer: BC Managed Care – PPO | Attending: Oncology | Admitting: Oncology

## 2019-12-12 DIAGNOSIS — Z1239 Encounter for other screening for malignant neoplasm of breast: Secondary | ICD-10-CM

## 2019-12-12 DIAGNOSIS — N6091 Unspecified benign mammary dysplasia of right breast: Secondary | ICD-10-CM | POA: Diagnosis not present

## 2019-12-12 DIAGNOSIS — N62 Hypertrophy of breast: Secondary | ICD-10-CM | POA: Insufficient documentation

## 2019-12-12 HISTORY — PX: BREAST BIOPSY: SHX20

## 2019-12-13 ENCOUNTER — Other Ambulatory Visit: Payer: Self-pay | Admitting: Oncology

## 2019-12-13 DIAGNOSIS — N6092 Unspecified benign mammary dysplasia of left breast: Secondary | ICD-10-CM

## 2019-12-13 DIAGNOSIS — Z1239 Encounter for other screening for malignant neoplasm of breast: Secondary | ICD-10-CM

## 2019-12-26 ENCOUNTER — Ambulatory Visit
Admission: RE | Admit: 2019-12-26 | Discharge: 2019-12-26 | Disposition: A | Discharge status: Home / Self Care | Payer: BC Managed Care – PPO | Source: Ambulatory Visit | Attending: Oncology | Admitting: Oncology

## 2019-12-26 ENCOUNTER — Other Ambulatory Visit: Payer: Self-pay | Admitting: Oncology

## 2019-12-26 DIAGNOSIS — N6092 Unspecified benign mammary dysplasia of left breast: Secondary | ICD-10-CM

## 2019-12-26 DIAGNOSIS — Z1239 Encounter for other screening for malignant neoplasm of breast: Secondary | ICD-10-CM

## 2020-01-02 ENCOUNTER — Other Ambulatory Visit: Payer: Self-pay | Admitting: Oncology

## 2020-01-12 ENCOUNTER — Other Ambulatory Visit: Payer: BC Managed Care – PPO

## 2020-06-11 ENCOUNTER — Other Ambulatory Visit: Payer: Self-pay | Admitting: *Deleted

## 2020-06-11 ENCOUNTER — Other Ambulatory Visit: Payer: Self-pay | Admitting: Oncology

## 2020-06-11 DIAGNOSIS — Z9189 Other specified personal risk factors, not elsewhere classified: Secondary | ICD-10-CM

## 2020-06-11 DIAGNOSIS — N6091 Unspecified benign mammary dysplasia of right breast: Secondary | ICD-10-CM

## 2020-06-11 DIAGNOSIS — Z1239 Encounter for other screening for malignant neoplasm of breast: Secondary | ICD-10-CM

## 2020-06-18 ENCOUNTER — Other Ambulatory Visit: Payer: BC Managed Care – PPO

## 2020-06-19 ENCOUNTER — Telehealth: Payer: Self-pay

## 2020-06-19 MED ORDER — TAMOXIFEN CITRATE 20 MG PO TABS: 20.0000 mg | ORAL_TABLET | Freq: Every day | ORAL | 0 refills | Status: DC

## 2020-06-19 NOTE — Telephone Encounter (Signed)
Patient called to inquire regarding status of MRI that was cancelled due to not having authorization.   RN spoke with revenue cycle specialist -currently an appeal has been filed and they are still pending review for authorization, once a decision has been made they will notify MD.   RN notified pt, pt verbalized understanding.  Upcoming apt for 5/26 was canceled and will be rescheduled once we have MRI date.

## 2020-06-21 ENCOUNTER — Inpatient Hospital Stay: Payer: BC Managed Care – PPO | Admitting: Oncology

## 2020-06-24 ENCOUNTER — Ambulatory Visit
Admission: RE | Admit: 2020-06-24 | Discharge: 2020-06-24 | Disposition: A | Discharge status: Home / Self Care | Payer: BC Managed Care – PPO | Source: Ambulatory Visit | Attending: Oncology | Admitting: Oncology

## 2020-06-24 ENCOUNTER — Other Ambulatory Visit: Payer: Self-pay

## 2020-06-24 DIAGNOSIS — Z1239 Encounter for other screening for malignant neoplasm of breast: Secondary | ICD-10-CM

## 2020-06-24 DIAGNOSIS — N6091 Unspecified benign mammary dysplasia of right breast: Secondary | ICD-10-CM

## 2020-06-24 DIAGNOSIS — Z9189 Other specified personal risk factors, not elsewhere classified: Secondary | ICD-10-CM

## 2020-06-24 MED ORDER — GADOBUTROL 1 MMOL/ML IV SOLN
8.0000 mL | Freq: Once | INTRAVENOUS | Status: AC | PRN
  Administered 2020-06-24: 8 mL via INTRAVENOUS

## 2020-06-26 ENCOUNTER — Other Ambulatory Visit: Payer: Self-pay | Admitting: Oncology

## 2020-06-27 ENCOUNTER — Telehealth: Payer: Self-pay | Admitting: *Deleted

## 2020-06-27 ENCOUNTER — Other Ambulatory Visit: Payer: Self-pay | Admitting: Oncology

## 2020-06-27 DIAGNOSIS — Z1239 Encounter for other screening for malignant neoplasm of breast: Secondary | ICD-10-CM

## 2020-06-27 DIAGNOSIS — Z9189 Other specified personal risk factors, not elsewhere classified: Secondary | ICD-10-CM

## 2020-06-27 DIAGNOSIS — N6091 Unspecified benign mammary dysplasia of right breast: Secondary | ICD-10-CM

## 2020-06-27 NOTE — Telephone Encounter (Signed)
Per MRI results and review with pt appointment for MD visit will be scheduled post mammogram in November 2022.  Note MRI had to be rescheduled due to pre auth therefore cancelling current MD appt.  Pt states good tolerance of tamoxifen with no current issues.  Order for yearly diagnostic mammo placed and request for visit in early December 2022.

## 2020-06-29 ENCOUNTER — Ambulatory Visit
Admission: RE | Admit: 2020-06-29 | Discharge: 2020-06-29 | Disposition: A | Discharge status: Home / Self Care | Payer: BC Managed Care – PPO | Source: Ambulatory Visit | Attending: Oncology | Admitting: Oncology

## 2020-06-29 ENCOUNTER — Other Ambulatory Visit: Payer: Self-pay

## 2020-06-29 DIAGNOSIS — Z1239 Encounter for other screening for malignant neoplasm of breast: Secondary | ICD-10-CM

## 2020-06-29 DIAGNOSIS — N6091 Unspecified benign mammary dysplasia of right breast: Secondary | ICD-10-CM

## 2020-06-29 DIAGNOSIS — Z9189 Other specified personal risk factors, not elsewhere classified: Secondary | ICD-10-CM

## 2020-10-03 ENCOUNTER — Other Ambulatory Visit: Payer: Self-pay | Admitting: Oncology

## 2020-11-28 ENCOUNTER — Other Ambulatory Visit: Payer: Self-pay | Admitting: *Deleted

## 2020-11-28 DIAGNOSIS — Z1239 Encounter for other screening for malignant neoplasm of breast: Secondary | ICD-10-CM

## 2020-11-29 ENCOUNTER — Inpatient Hospital Stay: Payer: BC Managed Care – PPO | Admitting: Oncology

## 2020-11-29 ENCOUNTER — Inpatient Hospital Stay: Payer: BC Managed Care – PPO

## 2020-11-29 NOTE — Progress Notes (Incomplete)
[facility]  Telephone:([phone] Fax:([phone]    ID: Latanga Leisure Lenzo DOB: February 14, 1970  MR#: [id number]  WPV#:[id number]  Patient Care Team: Silverio Lay, MD as PCP - General (Obstetrics and Gynecology) Claud Kelp, MD as Consulting Physician (General Surgery) Silverio Lay, MD as Consulting Physician (Obstetrics and Gynecology) Cherlyn Roberts, MD as Consulting Physician (Dermatology) Lynann Bologna, MD as Consulting Physician (Internal Medicine) Doyle Askew Emilee Hero, MD as Referring Physician OTHER MD:   CHIEF COMPLAINT: Atypical lobular hyperplasia; breast cancer high risk  CURRENT TREATMENT: Tamoxifen; intensified screening   INTERVAL HISTORY: Lorene Dy returns today for follow up of her high risk for breast cancer.  She continues on tamoxifen with good tolerance.  She is not having issues with hot flashes or vaginal discharge.  Recall she has an IUD in place   REVIEW OF SYSTEMS: Lorene Dy    COVID 19 VACCINATION STATUS: Not vaccinated as of 12/12/2019   HISTORY OF CURRENT ILLNESS: From the original intake note:  The patient had bilateral screening mammography 11/28/2015 showing new calcifications in both breasts.  She was set up for bilateral diagnostic mammography at the breast center 12/10/2015.  This found the breast density to be category C.  There were multiple areas of scattered microcalcifications in both breasts.  The largest individual group in the right breast measured 0.5 cm in the posterior upper outer quadrant.  In the left breast the largest group spanned 0.5 cm in the outer left breast.  In general these were all felt to be most likely benign and six-month follow-up versus biopsy was suggested.    The patient opted for biopsy, which was performed 12/12/2015, and showed ([id number]) in the left breast, only fibrocystic changes with calcifications.  In the right breast however there was atypical lobular hyperplasia.  The  patient was referred to surgery and after appropriate discussion with Dr. Derrell Lolling she proceeded to right lumpectomy on 01/08/2016.  The final pathology here ([id number]) showed only fibrocystic changes.  Note that the biopsy site was present and the biopsy clip was identified.  The patient's subsequent history is as detailed below.   PAST MEDICAL HISTORY: Past Medical History:  Diagnosis Date   Abnormal pap    Allergy    Anxiety    Arthritis    back   Asthma    as a child   Atypical lobular hyperplasia (ALH) of right breast 01/08/2016   Bipolar disorder (HCC)    Endometriosis    Headache    migraines   HSV-2 (herpes simplex virus 2) infection    Hypertension    Hypothyroidism    Infertility, female    Thyroid disease    hypothyroidism    PAST SURGICAL HISTORY: Past Surgical History:  Procedure Laterality Date   AUGMENTATION MAMMAPLASTY Bilateral    silicone on top of muscle   BREAST BIOPSY Left 12/12/2019   Fibrocystic changes   BREAST BIOPSY Right 12/12/2019   Fibrocystic changes with [facility]   BREAST EXCISIONAL BIOPSY Right 01/08/2020   Fibrocystic changes with [facility]   BREAST LUMPECTOMY WITH RADIOACTIVE SEED LOCALIZATION Right 01/08/2016   Procedure: RIGHT BREAST LUMPECTOMY WITH RADIOACTIVE SEED LOCALIZATION;  Surgeon: Claud Kelp, MD;  Location: [facility] OR;  Service: General;  Laterality: Right;   CESAREAN SECTION  2007   COLONOSCOPY     foot surgerey     SINUSOTOMY      FAMILY HISTORY Family History  Problem Relation Age of Onset   Fibromyalgia Mother    Alzheimer's disease  Paternal Grandmother    Stomach cancer Paternal Grandfather 57   Breast cancer Neg Hx   The patient's mother was adopted.  She does not know her biological family.  She is 50 years old as of January 2019.  The patient's father is 51 year old as of January 2019.  Patient has 1 brother, no sisters.  There is no history of breast or ovarian cancer in the family to the patient's  knowledge.   GYNECOLOGIC HISTORY:  No LMP recorded. (Menstrual status: IUD). 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  Their twins are [name] and [place], [name] as of January 2019.  The patient attends a local [employer]    ADVANCED DIRECTIVES: Not in place   HEALTH MAINTENANCE: Social History   Tobacco Use   Smoking status: Never   Smokeless tobacco: Never  Substance Use Topics   Alcohol use: Yes    Alcohol/week: 1.0 - 2.0 standard drink    Types: 1 - 2 Standard drinks or equivalent per week    Comment: occasional, 1-2 times a week, cocktail    Drug use: No     Colonoscopy: Age 56/ Gupta  PAP: UTD/ Rivard  Bone density:   Allergies  Allergen Reactions   Sulfa Antibiotics Nausea And Vomiting    Current Outpatient Medications  Medication Sig Dispense Refill   ALPRAZolam (XANAX) 1 MG tablet Take 1 mg by mouth daily as needed (for anxiety).      Aspirin-Salicylamide-Caffeine (BC HEADACHE POWDER PO) Take 1 packet by mouth daily as needed (for mild headaches.).     butalbital-acetaminophen-caffeine (FIORICET, ESGIC) 50-325-40 MG per tablet Take 1-2 tablets by mouth every 6 (six) hours as needed (for migraine headaches.).      carvedilol (COREG) 12.5 MG tablet Take 12.5 mg by mouth 2 (two) times daily.  4   hydrochlorothiazide (HYDRODIURIL) 25 MG tablet Take 25 mg by mouth daily.  4   levonorgestrel (MIRENA) 20 MCG/24HR IUD 1 each by Intrauterine route once.     levothyroxine (SYNTHROID, LEVOTHROID) 50 MCG tablet Take 50 mcg by mouth daily before breakfast.      OnabotulinumtoxinA (BOTOX IJ) Inject as directed. Gets botox injections every 3 months for migraines     promethazine (PHENERGAN) 25 MG tablet Take 25 mg by mouth every 4 (four) hours as  needed (for migraine induced nausea).      rizatriptan (MAXALT) 10 MG tablet Take 10 mg by mouth daily as needed (for migraine headaches.).      tamoxifen (NOLVADEX) 20 MG tablet TAKE 1 TABLET BY MOUTH ONCE DAILY 90 tablet 0   valACYclovir (VALTREX) 500 MG tablet Take 500 mg by mouth daily as needed. For cold sores.  0   vortioxetine HBr (TRINTELLIX) 10 MG TABS Take 10 mg by mouth at bedtime.     No current facility-administered medications for this visit.    OBJECTIVE:   There were no vitals filed for this visit.   There is no height or weight on file to calculate BMI.   Wt Readings from Last 3 Encounters:  06/09/19 179 lb 8 oz (81.4 kg)  04/19/18 172 lb 4.8 oz (78.2 kg)  06/17/17 166 lb 3.2 oz (75.4 kg)      ECOG FS:1 - Symptomatic but completely ambulatory  Sclerae unicteric, EOMs intact Wearing a mask No cervical or supraclavicular adenopathy Lungs no rales or rhonchi Heart regular rate and rhythm Abd soft, nontender, positive bowel sounds MSK no focal spinal tenderness, no upper extremity lymphedema Neuro: nonfocal, well oriented, appropriate affect Breasts:    {Telemedicine visit 12/12/2019}   LAB RESULTS:  CMP     Component Value Date/Time   NA 138 02/26/2016 1218   K 3.4 (L) 02/26/2016 1218   CL 101 01/08/2016 0937   CO2 28 02/26/2016 1218   GLUCOSE 91 02/26/2016 1218   BUN 18.6 02/26/2016 1218   CREATININE 0.7 02/26/2016 1218   CALCIUM 9.4 02/26/2016 1218   PROT 7.4 02/26/2016 1218   ALBUMIN 4.1 02/26/2016 1218   AST 20 02/26/2016 1218   ALT 32 02/26/2016 1218   ALKPHOS 85 02/26/2016 1218   BILITOT 0.49 02/26/2016 1218   GFRNONAA >60 01/08/2016 0937   GFRAA >60 01/08/2016 0937    No results found for: TOTALPROTELP, ALBUMINELP, A1GS, A2GS, BETS, BETA2SER, GAMS, MSPIKE, SPEI  No results found for: KPAFRELGTCHN, LAMBDASER, KAPLAMBRATIO  Lab Results  Component Value Date   WBC 6.1 01/08/2016   HGB 13.5 01/08/2016   HCT 40.2 01/08/2016   MCV 93.1  01/08/2016   PLT 250 01/08/2016   No results found for: LABCA2  No components found for: EXBMWU132  No results for input(s): INR in the last 168 hours.  No results found for: LABCA2  No results found for: GMW102  No results found for: VOZ366  No results found for: YQI347  No results found for: CA2729  No components found for: HGQUANT  No results found for: CEA1 / No results found for: CEA1   No results found for: AFPTUMOR  No results found for: CHROMOGRNA  No results found for: HGBA, HGBA2QUANT, HGBFQUANT, HGBSQUAN (Hemoglobinopathy evaluation)   No results found for: LDH  No results found for: IRON, TIBC, IRONPCTSAT (Iron and TIBC)  No results found for: FERRITIN  Urinalysis No results found for: COLORURINE, APPEARANCEUR, LABSPEC, PHURINE, GLUCOSEU, HGBUR, BILIRUBINUR, KETONESUR, PROTEINUR, UROBILINOGEN, NITRITE, LEUKOCYTESUR   STUDIES: No results found.     ELIGIBLE FOR AVAILABLE RESEARCH PROTOCOL: no   ASSESSMENT: 50 y.o. [place] woman status post right breast biopsy 12/12/2015 showing atypical lobular hyperplasia, subsequent lumpectomy 01/08/2016 showing no residual ALH  (1) additional risk factors:  (a) first live birth after age 21  (b) breast density category C  (c) unknown family history on mother's side  (2) intensified screening:   (a) Yearly mammography with tomography and biannual breast exam  (b) yearly breast MRI 6 months apart from mammography  (3) risk reduction strategy: Tamoxifen started March 2019  (a) Mirena in place   PLAN: Neysa Bonito is doing generally quite well.  The plan is to continue tamoxifen a total of 5 years.  We need to find out if we are dealing with fat necrosis or perhaps a different kind of change.  She is agreeable to right diagnostic mammography and ultrasonography and I have put in that order.  Likely we will talk again once we get those results but otherwise I am setting her up for an in person  visit here in 1 year  She knows to call for any other issue that may develop before the next visit.   Magrinat, Valentino Hue, MD  11/29/20 4:39 AM Medical Oncology and Hematology [facility] [address] [place], [place] [address] Tel. [phone]    Fax. [phone]   I,  Mickie Bail, am acting as scribe for Dr. Raymond Gurney C. Magrinat.  I, Ruthann Cancer MD, have reviewed the above documentation for accuracy and completeness, and I agree with the above.   *Total Encounter Time as defined by the Centers for Medicare and Medicaid Services includes, in addition to the face-to-face time of a patient visit (documented in the note above) non-face-to-face time: obtaining and reviewing outside history, ordering and reviewing medications, tests or procedures, care coordination (communications with other health care professionals or caregivers) and documentation in the medical record.

## 2020-12-26 ENCOUNTER — Ambulatory Visit: Payer: BC Managed Care – PPO

## 2020-12-27 ENCOUNTER — Other Ambulatory Visit: Payer: Self-pay | Admitting: Oncology

## 2021-01-15 NOTE — Progress Notes (Signed)
[facility]  Telephone:(336) [phone] Fax:(336) [phone]    ID: [name] DOB: 10-06-1970  MR#: [id number]  ZYS#:[id number]  Patient Care Team: Silverio Lay, MD as PCP - General (Obstetrics and Gynecology) Claud Kelp, MD as Consulting Physician (General Surgery) Silverio Lay, MD as Consulting Physician (Obstetrics and Gynecology) Cherlyn Roberts, MD as Consulting Physician (Dermatology) Lynann Bologna, MD as Consulting Physician (Internal Medicine) Doyle Askew Emilee Hero, MD as Referring Physician OTHER MD:   CHIEF COMPLAINT: Atypical lobular hyperplasia; breast cancer high risk  CURRENT TREATMENT: Tamoxifen; intensified screening   INTERVAL HISTORY: Jessica Navarro returns today for follow up of her high risk for breast cancer.  She continues on tamoxifen with good tolerance.  She is not having issues with hot flashes or vaginal discharge.  Recall she has an IUD in place  Since her last visit, she underwent breast MRI on 06/24/2020 showing: breast composition B; near-complete resolution of lower left breast non-mass-like enhancement, compatible with benign process; no evidence of breast malignancy.  She also underwent left breast ultrasound on 06/29/2020 showing: resolved fat necrosis in 7 o'clock position of left breast; significant decrease in size of a second area of fat necrosis in 6 o'clock position of left breast; no evidence of malignancy.  She is scheduled for annual screening mammogram on 01/31/2021.   REVIEW OF SYSTEMS: Jessica Navarro had COVID in August and stopped exercising regularly.  She does have a treadmill and an elliptical at home that she can use.  She is starting on a weight control program and knows that exercise must be a part of it.  Her twins are now 69, the girl in cheerleading and the boy in football.  Her husband just went through weight loss diet himself because of diabetes issues.  Her headaches are well controlled off Botox at present.   Aside from these issues a detailed review of systems today was stable   COVID 19 VACCINATION STATUS: Refuses vaccination; has had COVID January 2020 and August 2022   HISTORY OF CURRENT ILLNESS: From the original intake note:  The patient had bilateral screening mammography 11/28/2015 showing new calcifications in both breasts.  She was set up for bilateral diagnostic mammography at the breast center 12/10/2015.  This found the breast density to be category C.  There were multiple areas of scattered microcalcifications in both breasts.  The largest individual group in the right breast measured 0.5 cm in the posterior upper outer quadrant.  In the left breast the largest group spanned 0.5 cm in the outer left breast.  In general these were all felt to be most likely benign and six-month follow-up versus biopsy was suggested.    The patient opted for biopsy, which was performed 12/12/2015, and showed ([id number]) in the left breast, only fibrocystic changes with calcifications.  In the right breast however there was atypical lobular hyperplasia.  The patient was referred to surgery and after appropriate discussion with Dr. Derrell Lolling she proceeded to right lumpectomy on 01/08/2016.  The final pathology here ([id number]) showed only fibrocystic changes.  Note that the biopsy site was present and the biopsy clip was identified.  The patient's subsequent history is as detailed below.   PAST MEDICAL HISTORY: Past Medical History:  Diagnosis Date   Abnormal pap    Allergy    Anxiety    Arthritis    back   Asthma    as a child   Atypical lobular hyperplasia [facility]) of right breast 01/08/2016   Bipolar disorder (HCC)  Endometriosis    Headache    migraines   HSV-2 (herpes simplex virus 2) infection    Hypertension    Hypothyroidism    Infertility, female    Thyroid disease    hypothyroidism    PAST SURGICAL HISTORY: Past Surgical History:  Procedure Laterality Date   AUGMENTATION  MAMMAPLASTY Bilateral    silicone on top of muscle   BREAST BIOPSY Left 12/12/2019   Fibrocystic changes   BREAST BIOPSY Right 12/12/2019   Fibrocystic changes with [facility]   BREAST EXCISIONAL BIOPSY Right 01/08/2020   Fibrocystic changes with [facility]   BREAST LUMPECTOMY WITH RADIOACTIVE SEED LOCALIZATION Right 01/08/2016   Procedure: RIGHT BREAST LUMPECTOMY WITH RADIOACTIVE SEED LOCALIZATION;  Surgeon: Claud Kelp, MD;  Location: [facility] OR;  Service: General;  Laterality: Right;   CESAREAN SECTION  2007   COLONOSCOPY     foot surgerey     SINUSOTOMY      FAMILY HISTORY Family History  Problem Relation Age of Onset   Fibromyalgia Mother    Alzheimer's disease Paternal Grandmother    Stomach cancer Paternal Grandfather 53   Breast cancer Neg Hx   The patient's mother was adopted.  She does not know her biological family.  She is 50 years old as of January 2019.  The patient's father is 73 year old as of January 2019.  Patient has 1 brother, no sisters.  There is no history of breast or ovarian cancer in the family to the patient's knowledge.   GYNECOLOGIC HISTORY:  No LMP recorded. (Menstrual status: IUD). 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  Their twins are Jessica Navarro and Jessica Navarro, [name] as of January 2019.  The patient attends a local [employer]    ADVANCED DIRECTIVES: Not in place   HEALTH MAINTENANCE: Social History   Tobacco Use   Smoking status: Never   Smokeless tobacco: Never  Substance Use Topics   Alcohol use: Yes    Alcohol/week: 1.0 - 2.0 standard drink    Types: 1 - 2 Standard drinks or equivalent per week    Comment: occasional, 1-2 times a week, cocktail    Drug use: No     Colonoscopy: Age 72/ Gupta  PAP: UTD/ Rivard  Bone  density:   Allergies  Allergen Reactions   Sulfa Antibiotics Nausea And Vomiting    Current Outpatient Medications  Medication Sig Dispense Refill   ALPRAZolam (XANAX) 1 MG tablet Take 1 mg by mouth daily as needed (for anxiety).      Aspirin-Salicylamide-Caffeine (BC HEADACHE POWDER PO) Take 1 packet by mouth daily as needed (for mild headaches.).     butalbital-acetaminophen-caffeine (FIORICET, ESGIC) 50-325-40 MG per tablet Take 1-2 tablets by mouth every 6 (six) hours as needed (for migraine headaches.).      carvedilol (COREG) 12.5 MG tablet Take 12.5 mg by mouth 2 (two) times daily.  4   hydrochlorothiazide (HYDRODIURIL) 25 MG tablet Take 25 mg by mouth daily.  4   levonorgestrel (MIRENA) 20 MCG/24HR IUD 1 each by Intrauterine route once.     levothyroxine (SYNTHROID, LEVOTHROID) 50 MCG tablet Take 50 mcg by mouth daily before breakfast.      OnabotulinumtoxinA (BOTOX IJ) Inject as directed. Gets botox injections every 3 months for migraines  promethazine (PHENERGAN) 25 MG tablet Take 25 mg by mouth every 4 (four) hours as needed (for migraine induced nausea).      rizatriptan (MAXALT) 10 MG tablet Take 10 mg by mouth daily as needed (for migraine headaches.).      tamoxifen (NOLVADEX) 20 MG tablet TAKE 1 TABLET BY MOUTH ONCE DAILY 90 tablet 0   valACYclovir (VALTREX) 500 MG tablet Take 500 mg by mouth daily as needed. For cold sores.  0   vortioxetine HBr (TRINTELLIX) 10 MG TABS Take 10 mg by mouth at bedtime.     No current facility-administered medications for this visit.    OBJECTIVE: White woman who appears well  Vitals:   01/16/21 0919  BP: (!) 161/97  Pulse: 95  Resp: 18  Temp: (!) 97.3 F (36.3 C)  SpO2: 97%     Body mass index is 36.93 kg/m.   Wt Readings from Last 3 Encounters:  01/16/21 189 lb 1.6 oz (85.8 kg)  06/09/19 179 lb 8 oz (81.4 kg)  04/19/18 172 lb 4.8 oz (78.2 kg)     ECOG FS:1 - Symptomatic but completely ambulatory  Sclerae unicteric, EOMs  intact Wearing a mask No cervical or supraclavicular adenopathy Lungs no rales or rhonchi Heart regular rate and rhythm Abd soft, nontender, positive bowel sounds MSK no focal spinal tenderness, no upper extremity lymphedema Neuro: nonfocal, well oriented, appropriate affect Breasts: I do not palpate a mass in either breast.  There are no skin or nipple changes of concern.  Both axillae are benign.   LAB RESULTS:  CMP     Component Value Date/Time   NA 138 02/26/2016 1218   K 3.4 (L) 02/26/2016 1218   CL 101 01/08/2016 0937   CO2 28 02/26/2016 1218   GLUCOSE 91 02/26/2016 1218   BUN 18.6 02/26/2016 1218   CREATININE 0.7 02/26/2016 1218   CALCIUM 9.4 02/26/2016 1218   PROT 7.4 02/26/2016 1218   ALBUMIN 4.1 02/26/2016 1218   AST 20 02/26/2016 1218   ALT 32 02/26/2016 1218   ALKPHOS 85 02/26/2016 1218   BILITOT 0.49 02/26/2016 1218   GFRNONAA >60 01/08/2016 0937   GFRAA >60 01/08/2016 0937    No results found for: TOTALPROTELP, ALBUMINELP, A1GS, A2GS, BETS, BETA2SER, GAMS, MSPIKE, SPEI  No results found for: KPAFRELGTCHN, LAMBDASER, [facility]  Lab Results  Component Value Date   WBC 7.6 01/16/2021   NEUTROABS 4.5 01/16/2021   HGB 13.6 01/16/2021   HCT 40.9 01/16/2021   MCV 92.7 01/16/2021   PLT 303 01/16/2021   No results found for: LABCA2  No components found for: IONGEX528  No results for input(s): INR in the last 168 hours.  No results found for: LABCA2  No results found for: UXL244  No results found for: WNU272  No results found for: ZDG644  No results found for: CA2729  No components found for: HGQUANT  No results found for: CEA1 / No results found for: CEA1   No results found for: AFPTUMOR  No results found for: CHROMOGRNA  No results found for: HGBA, HGBA2QUANT, HGBFQUANT, HGBSQUAN (Hemoglobinopathy evaluation)   No results found for: LDH  No results found for: IRON, TIBC, IRONPCTSAT (Iron and TIBC)  No results found for:  FERRITIN  Urinalysis No results found for: COLORURINE, APPEARANCEUR, LABSPEC, PHURINE, GLUCOSEU, HGBUR, BILIRUBINUR, KETONESUR, PROTEINUR, UROBILINOGEN, NITRITE, LEUKOCYTESUR   STUDIES: No results found.    ELIGIBLE FOR AVAILABLE RESEARCH PROTOCOL: no   ASSESSMENT: 50 y.o. [place] woman status  post right breast biopsy 12/12/2015 showing atypical lobular hyperplasia, subsequent lumpectomy 01/08/2016 showing no residual ALH  (1) additional risk factors:  (a) first live birth after age 67  (b) breast density category C  (c) unknown family history on mother's side  (d) calculated lifetime breast cancer risk at baseline: 32.7% (or higher given "c" above)  (2) intensified screening:   (a) Yearly mammography with tomography and biannual breast exam  (b) yearly breast MRI 6 months apart from mammography  (3) risk reduction strategy: Tamoxifen started March 2019  (a) Mirena in place   PLAN: Neysa Bonito continues to tolerate tamoxifen well and the plan is to continue that through March 2024  She asked me what her baseline breast cancer risk wise.  We have some lacunae since we do not have all her family history available but based on the Gail model I calculated a 32.7% baseline risk which could but be of course higher if she turned out to have relatives with breast cancer on the side where she has no information  He understands taking tamoxifen for 5 years cuts that risk in half.  She is doing very well with intensified screening.  She is scheduled for mammography in January.  She will have her breast MRI in June.  Since she needs to change oncologists, as I am retiring, this is a good time I think for her to switch to my partners in [place] and specifically she is interested in seeing Dr. Gilman Buttner.  I am placing that referral today  She knows to call for any other issue that may develop before the next visit.   Makale Pindell, Valentino Hue, MD  01/16/21 9:38 AM Medical Oncology  and Hematology [facility] [address] [place], [place] [address] Tel. [phone]    Fax. [phone]   I, Mickie Bail, am acting as scribe for Dr. Valentino Hue. Larkin Alfred.  I, Ruthann Cancer MD, have reviewed the above documentation for accuracy and completeness, and I agree with the above.   *Total Encounter Time as defined by the Centers for Medicare and Medicaid Services includes, in addition to the face-to-face time of a patient visit (documented in the note above) non-face-to-face time: obtaining and reviewing outside history, ordering and reviewing medications, tests or procedures, care coordination (communications with other health care professionals or caregivers) and documentation in the medical record.

## 2021-01-16 ENCOUNTER — Inpatient Hospital Stay: Payer: BC Managed Care – PPO | Attending: Oncology

## 2021-01-16 ENCOUNTER — Telehealth: Payer: Self-pay | Admitting: Oncology

## 2021-01-16 ENCOUNTER — Inpatient Hospital Stay: Payer: BC Managed Care – PPO | Admitting: Oncology

## 2021-01-16 ENCOUNTER — Other Ambulatory Visit: Payer: Self-pay

## 2021-01-16 VITALS — BP 161/97 | HR 95 | Temp 97.3°F | Resp 18 | Ht 60.0 in | Wt 189.1 lb

## 2021-01-16 DIAGNOSIS — Z79899 Other long term (current) drug therapy: Secondary | ICD-10-CM | POA: Diagnosis not present

## 2021-01-16 DIAGNOSIS — Z79810 Long term (current) use of selective estrogen receptor modulators (SERMs): Secondary | ICD-10-CM | POA: Diagnosis not present

## 2021-01-16 DIAGNOSIS — N6091 Unspecified benign mammary dysplasia of right breast: Secondary | ICD-10-CM

## 2021-01-16 DIAGNOSIS — Z1239 Encounter for other screening for malignant neoplasm of breast: Secondary | ICD-10-CM

## 2021-01-16 LAB — CMP (CANCER CENTER ONLY)
ALT: 30 U/L (ref 0–44)
AST: 23 U/L (ref 15–41)
Albumin: 4.1 g/dL (ref 3.5–5.0)
Alkaline Phosphatase: 90 U/L (ref 38–126)
Anion gap: 9 (ref 5–15)
BUN: 22 mg/dL — ABNORMAL HIGH (ref 6–20)
CO2: 26 mmol/L (ref 22–32)
Calcium: 9.3 mg/dL (ref 8.9–10.3)
Chloride: 103 mmol/L (ref 98–111)
Creatinine: 0.71 mg/dL (ref 0.44–1.00)
GFR, Estimated: >60 mL/min (ref >60)
Glucose, Bld: 98 mg/dL (ref 70–99)
Potassium: 3.6 mmol/L (ref 3.5–5.1)
Sodium: 138 mmol/L (ref 135–145)
Total Bilirubin: 0.4 mg/dL (ref 0.3–1.2)
Total Protein: 7.2 g/dL (ref 6.5–8.1)

## 2021-01-16 LAB — CBC WITH DIFFERENTIAL (CANCER CENTER ONLY)
Abs Immature Granulocytes: 0.01 10*3/uL (ref 0.00–0.07)
Basophils Absolute: 0 10*3/uL (ref 0.0–0.1)
Basophils Relative: 0 %
Eosinophils Absolute: 0.3 10*3/uL (ref 0.0–0.5)
Eosinophils Relative: 4 %
HCT: 40.9 % (ref 36.0–46.0)
Hemoglobin: 13.6 g/dL (ref 12.0–15.0)
Immature Granulocytes: 0 %
Lymphocytes Relative: 27 %
Lymphs Abs: 2 10*3/uL (ref 0.7–4.0)
MCH: 30.8 pg (ref 26.0–34.0)
MCHC: 33.3 g/dL (ref 30.0–36.0)
MCV: 92.7 fL (ref 80.0–100.0)
Monocytes Absolute: 0.7 10*3/uL (ref 0.1–1.0)
Monocytes Relative: 10 %
Neutro Abs: 4.5 10*3/uL (ref 1.7–7.7)
Neutrophils Relative %: 59 %
Platelet Count: 303 10*3/uL (ref 150–400)
RBC: 4.41 MIL/uL (ref 3.87–5.11)
RDW: 12.9 % (ref 11.5–15.5)
WBC Count: 7.6 10*3/uL (ref 4.0–10.5)
nRBC: 0 % (ref 0.0–0.2)

## 2021-01-16 MED ORDER — TAMOXIFEN CITRATE 20 MG PO TABS: 20.0000 mg | ORAL_TABLET | Freq: Every day | ORAL | 4 refills | Status: DC

## 2021-01-16 NOTE — Telephone Encounter (Signed)
Scheduled new pt appt with Dr. Gilman Buttner per 12/21 referral from Dr. Darnelle Catalan. Called pt, no answer and vm was full. Mailed updated calendar to pt.

## 2021-01-31 ENCOUNTER — Ambulatory Visit: Payer: BC Managed Care – PPO

## 2021-02-22 ENCOUNTER — Ambulatory Visit
Admission: RE | Admit: 2021-02-22 | Discharge: 2021-02-22 | Disposition: A | Discharge status: Home / Self Care | Payer: BC Managed Care – PPO | Source: Ambulatory Visit | Attending: Oncology | Admitting: Oncology

## 2021-02-22 ENCOUNTER — Other Ambulatory Visit: Payer: Self-pay | Admitting: Oncology

## 2021-02-22 DIAGNOSIS — Z1239 Encounter for other screening for malignant neoplasm of breast: Secondary | ICD-10-CM

## 2021-02-22 DIAGNOSIS — Z9189 Other specified personal risk factors, not elsewhere classified: Secondary | ICD-10-CM

## 2021-02-22 DIAGNOSIS — N6091 Unspecified benign mammary dysplasia of right breast: Secondary | ICD-10-CM

## 2021-06-12 ENCOUNTER — Other Ambulatory Visit: Payer: Self-pay | Admitting: Oncology

## 2021-06-12 DIAGNOSIS — Z9189 Other specified personal risk factors, not elsewhere classified: Secondary | ICD-10-CM

## 2021-06-12 DIAGNOSIS — N6091 Unspecified benign mammary dysplasia of right breast: Secondary | ICD-10-CM

## 2021-06-17 ENCOUNTER — Ambulatory Visit
Admission: RE | Admit: 2021-06-17 | Discharge: 2021-06-17 | Disposition: A | Discharge status: Home / Self Care | Payer: BC Managed Care – PPO | Source: Ambulatory Visit | Attending: Oncology | Admitting: Oncology

## 2021-06-17 DIAGNOSIS — N6091 Unspecified benign mammary dysplasia of right breast: Secondary | ICD-10-CM

## 2021-06-17 DIAGNOSIS — Z9189 Other specified personal risk factors, not elsewhere classified: Secondary | ICD-10-CM

## 2021-06-17 MED ORDER — GADOBUTROL 1 MMOL/ML IV SOLN
7.0000 mL | Freq: Once | INTRAVENOUS | Status: AC | PRN
  Administered 2021-06-17: 7 mL via INTRAVENOUS

## 2021-07-21 ENCOUNTER — Other Ambulatory Visit: Payer: Self-pay | Admitting: Oncology

## 2021-07-21 DIAGNOSIS — N6091 Unspecified benign mammary dysplasia of right breast: Secondary | ICD-10-CM

## 2021-07-21 NOTE — Progress Notes (Signed)
[facility]  [phone] [phone]    ID: Jessica Navarro DOB: 06-21-1970  MR#: [id number]  YKD#:[id number]  Patient Care Team: Jessica Clark, NP as PCP - General (Internal Medicine) Jessica Kelp, MD as Consulting Physician (General Surgery) Jessica Lay, MD as Consulting Physician (Obstetrics and Gynecology) Jessica Roberts, MD as Consulting Physician (Dermatology) Jessica Bologna, MD as Consulting Physician (Internal Medicine) Jessica Askew Emilee Hero, MD as Referring Physician  DATE: 07/22/21  CHIEF COMPLAINT: Atypical lobular hyperplasia; breast cancer high risk  CURRENT TREATMENT: Tamoxifen; intensified screening   INTERVAL HISTORY: Jessica Navarro returns today for follow up of her high risk for breast cancer. She continues on tamoxifen with good tolerance.  She is not having issues with hot flashes or vaginal discharge.  She has an IUD in place.She last saw Jessica Navarro 01/16/21 and is transferring her care here.  He calculated her lifetime risk of breast cancer at 32.7%. CBC and CMP are normal   Since her last visit, she underwent breast MRI on 06/20/21 showing: breast composition B; with complete resolution of lower left breast non-mass-like enhancement, compatible with benign process; no evidence of breast malignancy.  Her last annual screening mammogram was on 01/31/2021, and so will be due in January.  She had her 1st colonoscopy at age 72 and last one 2 months ago by Dr. Loreta Navarro in [place].   REVIEW OF SYSTEMS: Review of systems today was stable. Her appetite is good and she has no complaints. She is still premenopausal so has not had a DEXA.   COVID 19 VACCINATION STATUS: Refuses vaccination; has had COVID January 2020 and August 2022   HISTORY OF CURRENT ILLNESS: From the original intake note:  The patient had bilateral screening mammography 11/28/2015 showing new calcifications in both breasts.  She was set up for  bilateral diagnostic mammography at the breast center 12/10/2015.  This found the breast density to be category C.  There were multiple areas of scattered microcalcifications in both breasts.  The largest individual group in the right breast measured 0.5 cm in the posterior upper outer quadrant.  In the left breast the largest group spanned 0.5 cm in the outer left breast.  In general these were all felt to be most likely benign and six-month follow-up versus biopsy was suggested.    The patient opted for biopsy, which was performed 12/12/2015, and showed ([id number]) in the left breast, only fibrocystic changes with calcifications.  In the right breast however there was atypical lobular hyperplasia.  The patient was referred to surgery and after appropriate discussion with Jessica Navarro she proceeded to right lumpectomy on 01/08/2016.  The final pathology here ([id number]) showed only fibrocystic changes.  Note that the biopsy site was present and the biopsy clip was identified.  The patient's subsequent history is as detailed below.   PAST MEDICAL HISTORY: Past Medical History:  Diagnosis Date   Abnormal pap    Allergy    Anxiety    Arthritis    back   Asthma    as a child   Atypical lobular hyperplasia (ALH) of right breast 01/08/2016   Bipolar disorder (HCC)    being treated for depression   Endometriosis    Headache    migraines   HSV-2 (herpes simplex virus 2) infection    Hypertension    Hypothyroidism    Infertility, female    Thyroid disease    hypothyroidism    PAST SURGICAL HISTORY: Past Surgical History:  Procedure  Laterality Date   AUGMENTATION MAMMAPLASTY Bilateral    silicone on top of muscle   BREAST BIOPSY Left 12/12/2019   Fibrocystic changes   BREAST BIOPSY Right 12/12/2019   Fibrocystic changes with [facility]   BREAST EXCISIONAL BIOPSY Right 01/08/2020   Fibrocystic changes with [facility]   BREAST LUMPECTOMY WITH RADIOACTIVE SEED LOCALIZATION Right 01/08/2016    Procedure: RIGHT BREAST LUMPECTOMY WITH RADIOACTIVE SEED LOCALIZATION;  Surgeon: Jessica Kelp, MD;  Location: [facility] OR;  Service: General;  Laterality: Right;   CESAREAN SECTION  2007   COLONOSCOPY     foot surgerey     SINUSOTOMY      FAMILY HISTORY Family History  Problem Relation Age of Onset   Fibromyalgia Mother    Alzheimer's disease Paternal Grandmother    Stomach cancer Paternal Grandfather 82   Breast cancer Neg Hx   The patient's mother was adopted.  She does not know her biological family.  She is 51 years old as of January 2019.  The patient's father is 70 year old as of January 2019.  Patient has 1 brother, no sisters.  There is no history of breast or ovarian cancer in the family to the patient's knowledge.   GYNECOLOGIC HISTORY:  No LMP recorded (lmp unknown). (Menstrual status: IUD). 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  Their twins are Jessica Navarro and Jessica Navarro, [name] as of January 2019.  The patient attends a local [address]. She was previously a Interior and spatial designer for 16 years, until 2017    ADVANCED DIRECTIVES: Not in place   HEALTH MAINTENANCE: Social History   Tobacco Use   Smoking status: Never   Smokeless tobacco: Never  Vaping Use   Vaping Use: Never used  Substance Use Topics   Alcohol use: Yes    Alcohol/week: 1.0 - 2.0 standard drink of alcohol    Types: 1 - 2 Standard drinks or equivalent per week    Comment: occasional, 1-2 times a week, cocktail    Drug use: Never     Colonoscopy: Age 74/ [facility]  PAP: UTD/ [facility]  Bone density: None yet   Allergies  Allergen Reactions   Sulfa Antibiotics Nausea And Vomiting    Other reaction(s): vomiting Other reaction(s): GI intolerance, Vomiting    Current Outpatient Medications   Medication Sig Dispense Refill   cyanocobalamin (,VITAMIN B-12,) 1000 MCG/ML injection Inject into the muscle every 30 (thirty) days.     ALPRAZolam (XANAX) 1 MG tablet Take 1 mg by mouth daily as needed (for anxiety).      ARIPiprazole (ABILIFY) 10 MG tablet Take 10 mg by mouth at bedtime.     butalbital-acetaminophen-caffeine (FIORICET, ESGIC) 50-325-40 MG per tablet Take 1-2 tablets by mouth every 6 (six) hours as needed (for migraine headaches.).      carvedilol (COREG) 6.25 MG tablet Take 6.25 mg by mouth 2 (two) times daily.     DEXILANT 60 MG capsule Take 1 capsule by mouth every other day.     hydrochlorothiazide (HYDRODIURIL) 25 MG tablet Take 25 mg by mouth daily.  4   levonorgestrel (MIRENA) 20 MCG/24HR IUD 1 each by Intrauterine route once.     levothyroxine (SYNTHROID, LEVOTHROID) 50 MCG tablet Take 50 mcg by mouth daily before breakfast.      Liraglutide -  Weight Management (SAXENDA) 18 MG/3ML SOPN Inject 0.6 mg into the skin daily.     promethazine (PHENERGAN) 25 MG tablet Take 25 mg by mouth every 4 (four) hours as needed (for migraine induced nausea).      rizatriptan (MAXALT) 10 MG tablet Take 10 mg by mouth daily as needed (for migraine headaches.).      tamoxifen (NOLVADEX) 20 MG tablet Take 1 tablet (20 mg total) by mouth daily. 90 tablet 4   valACYclovir (VALTREX) 500 MG tablet Take 500 mg by mouth daily as needed. For cold sores.  0   vortioxetine HBr (TRINTELLIX) 10 MG TABS Take 10 mg by mouth at bedtime.     VYVANSE 50 MG capsule Take 50 mg by mouth every morning.     No current facility-administered medications for this visit.    OBJECTIVE: White woman who appears well  Vitals:   07/22/21 1528  BP: 131/85  Pulse: 74  Resp: 18  Temp: 97.9 F (36.6 C)  SpO2: 98%      Body mass index is 32.36 kg/m.   Wt Readings from Last 3 Encounters:  07/22/21 176 lb 14.4 oz (80.2 kg)  01/16/21 189 lb 1.6 oz (85.8 kg)  06/09/19 179 lb 8 oz (81.4 kg)     ECOG FS:1 -  Symptomatic but completely ambulatory  Sclerae unicteric, EOMs intact No cervical or supraclavicular adenopathy Lungs no rales or rhonchi Heart regular rate and rhythm Abd soft, nontender, positive bowel sounds MSK no focal spinal tenderness, no upper extremity lymphedema Neuro: nonfocal, well oriented, appropriate affect Breasts: I do not palpate a mass in either breast.  There are no skin or nipple changes of concern. She has bilateral implants in place with a small scar in the inferior left side, at the inframammary fold. She also has a scar of the right axillary area.  Both axillae are benign.   LAB RESULTS:  CMP     Component Value Date/Time   NA 137 07/22/2021 0000   NA 138 02/26/2016 1218   K 3.7 07/22/2021 0000   K 3.4 (L) 02/26/2016 1218   CL 102 07/22/2021 0000   CO2 31 (A) 07/22/2021 0000   CO2 28 02/26/2016 1218   GLUCOSE 98 01/16/2021 0909   GLUCOSE 91 02/26/2016 1218   BUN 17 07/22/2021 0000   BUN 18.6 02/26/2016 1218   CREATININE 0.7 07/22/2021 0000   CREATININE 0.71 01/16/2021 0909   CREATININE 0.7 02/26/2016 1218   CALCIUM 9.1 07/22/2021 0000   CALCIUM 9.4 02/26/2016 1218   PROT 7.2 01/16/2021 0909   PROT 7.4 02/26/2016 1218   ALBUMIN 4.1 07/22/2021 0000   ALBUMIN 4.1 02/26/2016 1218   AST 34 07/22/2021 0000   AST 23 01/16/2021 0909   AST 20 02/26/2016 1218   ALT 35 07/22/2021 0000   ALT 30 01/16/2021 0909   ALT 32 02/26/2016 1218   ALKPHOS 111 07/22/2021 0000   ALKPHOS 85 02/26/2016 1218   BILITOT 0.4 01/16/2021 0909   BILITOT 0.49 02/26/2016 1218   GFRNONAA >60 01/16/2021 0909   GFRAA >60 01/08/2016 0937    No results found for: "TOTALPROTELP", "ALBUMINELP", "A1GS", "A2GS", "BETS", "BETA2SER", "GAMS", "MSPIKE", "SPEI"  No results found for: "KPAFRELGTCHN", "LAMBDASER", "KAPLAMBRATIO"  Lab Results  Component Value Date   WBC 7.8 07/22/2021   NEUTROABS 4.45 07/22/2021   HGB 13.6 07/22/2021   HCT 41 07/22/2021   MCV 92.7 01/16/2021   PLT  263 07/22/2021   Urinalysis No results found for: "  COLORURINE", "APPEARANCEUR", "LABSPEC", "PHURINE", "GLUCOSEU", "HGBUR", "BILIRUBINUR", "KETONESUR", "PROTEINUR", "UROBILINOGEN", "NITRITE", "LEUKOCYTESUR"   STUDIES: MRI from 06/20/21 is benign.    ELIGIBLE FOR AVAILABLE RESEARCH PROTOCOL: no   ASSESSMENT: 51 y.o. [place] woman status post right breast biopsy 12/12/2015 showing atypical lobular hyperplasia, subsequent lumpectomy 01/08/2016 showing no residual ALH  (1) additional risk factors:  (a) first live birth after age 56  (b) breast density category C  (c) unknown family history on mother's side  (d) calculated lifetime breast cancer risk at baseline: 32.7% (or higher given "c" above)  (2) intensified screening:   (a) Yearly mammography with tomography and biannual breast exam  (b) yearly breast MRI 6 months apart from mammography  (3) risk reduction strategy: Tamoxifen started March 2019  (a) Mirena in place   PLAN: She will continue the tamoxifen until March of 2024. She will be scheduled for annual bilateral mammogram in January. I reviewed the plan, which is to continue her surveillance as outlined by Jessica Navarro. She agrees and questions were answered. She knows to call for any other issue that may develop before the next visit.

## 2021-07-22 ENCOUNTER — Other Ambulatory Visit: Payer: Self-pay | Admitting: Oncology

## 2021-07-22 ENCOUNTER — Encounter: Payer: Self-pay | Admitting: Oncology

## 2021-07-22 ENCOUNTER — Other Ambulatory Visit: Payer: Self-pay | Admitting: Hematology and Oncology

## 2021-07-22 ENCOUNTER — Inpatient Hospital Stay: Payer: BC Managed Care – PPO

## 2021-07-22 ENCOUNTER — Inpatient Hospital Stay: Payer: BC Managed Care – PPO | Attending: Oncology | Admitting: Oncology

## 2021-07-22 VITALS — BP 131/85 | HR 74 | Temp 97.9°F | Resp 18 | Ht 62.0 in | Wt 176.9 lb

## 2021-07-22 DIAGNOSIS — Z9189 Other specified personal risk factors, not elsewhere classified: Secondary | ICD-10-CM | POA: Diagnosis not present

## 2021-07-22 DIAGNOSIS — N6091 Unspecified benign mammary dysplasia of right breast: Secondary | ICD-10-CM | POA: Diagnosis not present

## 2021-07-22 LAB — BASIC METABOLIC PANEL
BUN: 17 (ref 4–21)
CO2: 31 — AB (ref 13–22)
Chloride: 102 (ref 99–108)
Creatinine: 0.7 (ref 0.5–1.1)
Glucose: 84
Potassium: 3.7 mEq/L (ref 3.5–5.1)
Sodium: 137 (ref 137–147)

## 2021-07-22 LAB — COMPREHENSIVE METABOLIC PANEL
Albumin: 4.1 (ref 3.5–5.0)
Calcium: 9.1 (ref 8.7–10.7)

## 2021-07-22 LAB — CBC AND DIFFERENTIAL
HCT: 41 (ref 36–46)
Hemoglobin: 13.6 (ref 12.0–16.0)
Neutrophils Absolute: 4.45
Platelets: 263 10*3/uL (ref 150–400)
WBC: 7.8

## 2021-07-22 LAB — HEPATIC FUNCTION PANEL
ALT: 35 U/L (ref 7–35)
AST: 34 (ref 13–35)
Alkaline Phosphatase: 111 (ref 25–125)
Bilirubin, Total: 0.5

## 2021-07-22 LAB — CBC: RBC: 4.42 (ref 3.87–5.11)

## 2022-01-29 ENCOUNTER — Other Ambulatory Visit: Payer: Self-pay | Admitting: Oncology

## 2022-01-29 DIAGNOSIS — Z1231 Encounter for screening mammogram for malignant neoplasm of breast: Secondary | ICD-10-CM

## 2022-02-12 ENCOUNTER — Other Ambulatory Visit: Payer: Self-pay | Admitting: Obstetrics and Gynecology

## 2022-02-12 DIAGNOSIS — Z79810 Long term (current) use of selective estrogen receptor modulators (SERMs): Secondary | ICD-10-CM

## 2022-02-25 ENCOUNTER — Other Ambulatory Visit: Payer: Self-pay | Admitting: Specialist

## 2022-02-25 DIAGNOSIS — K760 Fatty (change of) liver, not elsewhere classified: Secondary | ICD-10-CM

## 2022-02-25 DIAGNOSIS — R1010 Upper abdominal pain, unspecified: Secondary | ICD-10-CM

## 2022-02-26 ENCOUNTER — Ambulatory Visit
Admission: RE | Admit: 2022-02-26 | Discharge: 2022-02-26 | Disposition: A | Discharge status: Home / Self Care | Payer: BC Managed Care – PPO | Source: Ambulatory Visit | Attending: Specialist | Admitting: Specialist

## 2022-02-26 DIAGNOSIS — R1010 Upper abdominal pain, unspecified: Secondary | ICD-10-CM

## 2022-02-26 DIAGNOSIS — K760 Fatty (change of) liver, not elsewhere classified: Secondary | ICD-10-CM

## 2022-02-26 MED ORDER — GADOPICLENOL 0.5 MMOL/ML IV SOLN
10.0000 mL | Freq: Once | INTRAVENOUS | Status: AC | PRN
  Administered 2022-02-26: 8 mL via INTRAVENOUS

## 2022-02-27 ENCOUNTER — Other Ambulatory Visit (HOSPITAL_COMMUNITY): Payer: Self-pay | Admitting: Gastroenterology

## 2022-02-27 DIAGNOSIS — R1011 Right upper quadrant pain: Secondary | ICD-10-CM

## 2022-03-04 ENCOUNTER — Encounter (HOSPITAL_COMMUNITY)
Admission: RE | Admit: 2022-03-04 | Discharge: 2022-03-04 | Disposition: A | Discharge status: Home / Self Care | Payer: BC Managed Care – PPO | Source: Ambulatory Visit | Attending: Gastroenterology | Admitting: Gastroenterology

## 2022-03-04 DIAGNOSIS — R1011 Right upper quadrant pain: Secondary | ICD-10-CM | POA: Insufficient documentation

## 2022-03-04 MED ORDER — TECHNETIUM TC 99M MEBROFENIN IV KIT
5.5000 | PACK | Freq: Once | INTRAVENOUS | Status: AC | PRN
  Administered 2022-03-04: 5.5 via INTRAVENOUS

## 2022-03-21 ENCOUNTER — Ambulatory Visit
Admission: RE | Admit: 2022-03-21 | Discharge: 2022-03-21 | Disposition: A | Discharge status: Home / Self Care | Payer: BC Managed Care – PPO | Source: Ambulatory Visit | Attending: Oncology | Admitting: Oncology

## 2022-03-21 DIAGNOSIS — Z1231 Encounter for screening mammogram for malignant neoplasm of breast: Secondary | ICD-10-CM

## 2022-06-26 ENCOUNTER — Other Ambulatory Visit: Payer: Self-pay | Admitting: Oncology

## 2022-06-26 DIAGNOSIS — Z9189 Other specified personal risk factors, not elsewhere classified: Secondary | ICD-10-CM

## 2022-08-05 ENCOUNTER — Ambulatory Visit
Admission: RE | Admit: 2022-08-05 | Discharge: 2022-08-05 | Disposition: A | Discharge status: Home / Self Care | Payer: BC Managed Care – PPO | Source: Ambulatory Visit | Attending: Oncology | Admitting: Oncology

## 2022-08-05 DIAGNOSIS — Z9189 Other specified personal risk factors, not elsewhere classified: Secondary | ICD-10-CM

## 2022-08-05 MED ORDER — GADOPICLENOL 0.5 MMOL/ML IV SOLN
8.0000 mL | Freq: Once | INTRAVENOUS | Status: AC | PRN
  Administered 2022-08-05: 8 mL via INTRAVENOUS

## 2022-08-06 ENCOUNTER — Other Ambulatory Visit: Payer: Self-pay | Admitting: Oncology

## 2022-08-06 DIAGNOSIS — N6314 Unspecified lump in the right breast, lower inner quadrant: Secondary | ICD-10-CM

## 2022-08-06 DIAGNOSIS — R9389 Abnormal findings on diagnostic imaging of other specified body structures: Secondary | ICD-10-CM

## 2022-08-07 ENCOUNTER — Ambulatory Visit
Admission: RE | Admit: 2022-08-07 | Discharge: 2022-08-07 | Disposition: A | Discharge status: Home / Self Care | Payer: BC Managed Care – PPO | Source: Ambulatory Visit | Attending: Oncology | Admitting: Oncology

## 2022-08-07 DIAGNOSIS — R9389 Abnormal findings on diagnostic imaging of other specified body structures: Secondary | ICD-10-CM

## 2022-08-07 MED ORDER — GADOPICLENOL 0.5 MMOL/ML IV SOLN
7.0000 mL | Freq: Once | INTRAVENOUS | Status: AC | PRN
  Administered 2022-08-07: 7 mL via INTRAVENOUS

## 2022-08-08 ENCOUNTER — Other Ambulatory Visit: Payer: BC Managed Care – PPO

## 2022-08-08 ENCOUNTER — Other Ambulatory Visit: Payer: Self-pay | Admitting: Specialist

## 2022-08-08 ENCOUNTER — Ambulatory Visit: Payer: BC Managed Care – PPO | Admitting: Oncology

## 2022-08-08 DIAGNOSIS — N6314 Unspecified lump in the right breast, lower inner quadrant: Secondary | ICD-10-CM

## 2022-08-08 NOTE — Progress Notes (Signed)
[facility]  [address] [place],  [place]  [address] [phone] Patient Care Team: Krystal Clark, NP as PCP - General (Internal Medicine) Claud Kelp, MD as Consulting Physician (General Surgery) Silverio Lay, MD as Consulting Physician (Obstetrics and Gynecology) Cherlyn Roberts, MD as Consulting Physician (Dermatology) Lynann Bologna, MD as Consulting Physician (Internal Medicine) Doyle Askew Emilee Hero, MD as Referring Physician  Clinic Day:  08/14/22  Referring physician:   ASSESSMENT & PLAN  ASSESSMENT: 52 y.o. [name] status post right breast biopsy 12/12/2015 showing atypical lobular hyperplasia, subsequent lumpectomy 01/08/2016 showing no residual ALH  (1) additional risk factors:  (a) first live birth after age 72  (b) breast density category C  (c) unknown family history on mother's side  (d) calculated lifetime breast cancer risk at baseline: 32.7% (or higher given "c" above)  (2) intensified screening:   (a) Yearly mammography with tomography and biannual breast exam  (b) yearly breast MRI 6 months apart from mammography  (3) risk reduction strategy: Tamoxifen started March 2019  (a) Mirena in place   PLAN: She continues Tamoxifen for chemoprevention with good tolerance. Patient had an MRI of bilateral breast without contrast that revealed a indeterminate enhancing mass in the lower inner right breast measuring 0.8cm. She had a biopsy of the right breast done and pathology on 08/07/2022 revealed a radial scar measuring 6mm which appears completely contained within the biopsy. She also had fibrocystic changes including stromal fibrosis, cystic dilation of ducts, adenosis, columnar cell change, and usual ductal hyperplasia. Negative for micro-calcifications, atypia, in situ or carcinoma. She inquired if she would need anything removed and I would like to have a second opinion of a Careers adviser. I  will refer her to Dr. Georgiana Shore to see whether the radial scar needs any further resection. I normally alternates MRI's and mammograms every 6 months. Radiology recommended MRI in 6 months so I will ask them whether a mammogram should be done then also, or to wait another 6 months. She had a bone density scan done on 08/12/2022 that revealed osteopenia, her left femur T-score is -1.3, dual femur T-score -0.4, and left forearm 0.2. She still has an IUD in place. I advised her to take an oral calcium and vitamin D supplement. The patient understands the plans discussed today and is in agreement with them.  She knows to contact our office if she develops concerns prior to her next visit.   I provided 20 minutes of face-to-face time during this this encounter and > 50% was spent counseling as documented under my assessment and plan.      Dellia Beckwith, MD [facility] [address] [place] [address] Dept: [phone] Dept Fax: [phone]   CHIEF COMPLAINT:  CC:   Atypical lobular hyperplasia; breast cancer high risk  Current Treatment:   Tamoxifen started 2019; intensified screening  HISTORY OF PRESENT ILLNESS:  The patient had bilateral screening mammography 11/28/2015 showing new calcifications in both breasts.  She was set up for bilateral diagnostic mammography at the breast center 12/10/2015.  This found the breast density to be category C.  There were multiple areas of scattered microcalcifications in both breasts.  The largest individual group in the right breast measured 0.5 cm in the posterior upper outer quadrant.  In the left breast the largest group spanned 0.5 cm in the outer left breast.  In general these were all felt to  be most likely benign and six-month follow-up versus biopsy was suggested. The patient opted for biopsy, which was performed 12/12/2015, and showed ([id number]) in the left breast, only  fibrocystic changes with calcifications.  In the right breast however there was atypical lobular hyperplasia.  The patient was referred to surgery and after appropriate discussion with Dr. Derrell Lolling she proceeded to right lumpectomy on 01/08/2016.  The final pathology here ([id number]) showed only fibrocystic changes.  Note that the biopsy site was present and the biopsy clip was identified. Her calculated her lifetime risk of breast cancer at 32.7%.   INTERVAL HISTORY:  Jessica Navarro returns today for follow up of her high risk for breast cancer. Her calculated her lifetime risk of breast cancer at 32.7%. Patient states that she feels well and has no complaints of pain. She continues Tamoxifen for chemoprevention with good tolerance. Patient had an MRI of bilateral breast without contrast that revealed a indeterminate enhancing mass in the lower inner right breast measuring 0.8cm. She had a biopsy of the right breast done and pathology on 08/07/2022 revealed a radial scar measuring 6mm which appears completely contained within the biopsy. She also had fibrocystic changes including stromal fibrosis, cystic dilation of ducts, adenosis, columnar cell change, and usual ductal hyperplasia. Negative for micro-calcifications, atypia, in situ or carcinoma. She inquired if she would need anything removed and I would like to have a second opinion of a Careers adviser. I will refer her to Dr. Georgiana Shore to see whether the radial scar needs any further resection. I normally alternates MRI's and mammograms every 6 months. Radiology recommended MRI in 6 months so I will ask them whether a mammogram should be done then also, or to wait another 6 months. She had a bone density scan done on 08/12/2022 that revealed osteopenia, her left femur T-score is -1.3, dual femur T-score -0.4, and left forearm 0.2. She still has an IUD in place. I advised her to take an oral calcium and vitamin D supplement. She denies signs of infection such as sore  throat, sinus drainage, cough, or urinary symptoms.  She denies fevers or recurrent chills. She denies pain. She denies nausea, vomiting, chest pain, dyspnea or cough. Her appetite is good and her weight has increased 21 pounds over last year .  REVIEW OF SYSTEMS:  Review of Systems  Constitutional: Negative.  Negative for appetite change, chills, diaphoresis, fatigue, fever and unexpected weight change.  HENT:  Negative.  Negative for hearing loss, lump/mass, mouth sores, nosebleeds, sore throat, tinnitus, trouble swallowing and voice change.   Eyes: Negative.  Negative for eye problems and icterus.  Respiratory: Negative.  Negative for chest tightness, cough, hemoptysis, shortness of breath and wheezing.   Cardiovascular: Negative.  Negative for chest pain, leg swelling and palpitations.  Gastrointestinal: Negative.  Negative for abdominal distention, abdominal pain, blood in stool, constipation, diarrhea, nausea, rectal pain and vomiting.  Endocrine: Negative.   Genitourinary: Negative.  Negative for bladder incontinence, difficulty urinating, dyspareunia, dysuria, frequency, hematuria, menstrual problem, nocturia, pelvic pain, vaginal bleeding and vaginal discharge.   Musculoskeletal:  Negative for arthralgias, back pain, flank pain, gait problem, myalgias, neck pain and neck stiffness.  Skin: Negative.  Negative for itching, rash and wound.  Neurological:  Negative for dizziness, extremity weakness, gait problem, headaches, light-headedness, numbness, seizures and speech difficulty.  Hematological: Negative.  Negative for adenopathy. Does not bruise/bleed easily.  Psychiatric/Behavioral: Negative.  Negative for confusion, decreased concentration, depression, sleep disturbance and suicidal ideas. The patient is not  nervous/anxious.    VITALS:      Body mass index is 36.1 kg/m.   Wt Readings from Last 3 Encounters:  08/14/22 197 lb 6.4 oz (89.5 kg)  07/22/21 176 lb 14.4 oz (80.2 kg)   01/16/21 189 lb 1.6 oz (85.8 kg)   PHYSICAL EXAM:  Physical Exam Vitals and nursing note reviewed.  Constitutional:      General: She is not in acute distress.    Appearance: Normal appearance. She is normal weight. She is not ill-appearing, toxic-appearing or diaphoretic.  HENT:     Head: Normocephalic and atraumatic.     Right Ear: Tympanic membrane, ear canal and external ear normal. There is no impacted cerumen.     Left Ear: Tympanic membrane, ear canal and external ear normal. There is no impacted cerumen.     Nose: Nose normal. No congestion or rhinorrhea.     Mouth/Throat:     Mouth: Mucous membranes are moist.     Pharynx: Oropharynx is clear. No oropharyngeal exudate or posterior oropharyngeal erythema.  Eyes:     General: No scleral icterus.       Right eye: No discharge.        Left eye: No discharge.     Extraocular Movements: Extraocular movements intact.     Conjunctiva/sclera: Conjunctivae normal.     Pupils: Pupils are equal, round, and reactive to light.  Neck:     Vascular: No carotid bruit.  Cardiovascular:     Rate and Rhythm: Normal rate and regular rhythm.     Pulses: Normal pulses.     Heart sounds: Normal heart sounds. No murmur heard.    No friction rub. No gallop.  Pulmonary:     Effort: Pulmonary effort is normal. No respiratory distress.     Breath sounds: Normal breath sounds. No stridor. No wheezing, rhonchi or rales.  Chest:     Chest wall: No tenderness.     Comments: Ecchymosis in the lateral right breast at about 9 o'clock as expected from the biopsy.  No masses in either breast.  Bilateral breast implants. She  has scaring of the left breast which is well healed.  Abdominal:     General: Bowel sounds are normal. There is no distension.     Palpations: Abdomen is soft. There is no hepatomegaly, splenomegaly or mass.     Tenderness: There is no abdominal tenderness. There is no right CVA tenderness, left CVA tenderness, guarding or  rebound.     Hernia: No hernia is present.  Musculoskeletal:        General: No swelling, tenderness, deformity or signs of injury. Normal range of motion.     Cervical back: Normal range of motion and neck supple. No rigidity or tenderness.     Right lower leg: No edema.     Left lower leg: No edema.  Lymphadenopathy:     Cervical: No cervical adenopathy.     Right cervical: No superficial, deep or posterior cervical adenopathy.    Left cervical: No superficial, deep or posterior cervical adenopathy.     Upper Body:     Right upper body: No supraclavicular, axillary or pectoral adenopathy.     Left upper body: No supraclavicular, axillary or pectoral adenopathy.  Skin:    General: Skin is warm and dry.     Coloration: Skin is not jaundiced or pale.     Findings: No bruising, erythema, lesion or rash.  Neurological:     General: No  focal deficit present.     Mental Status: She is alert and oriented to person, place, and time. Mental status is at baseline.     Cranial Nerves: No cranial nerve deficit.     Sensory: No sensory deficit.     Motor: No weakness.     Coordination: Coordination normal.     Gait: Gait normal.     Deep Tendon Reflexes: Reflexes normal.  Psychiatric:        Mood and Affect: Mood normal.        Behavior: Behavior normal.        Thought Content: Thought content normal.        Judgment: Judgment normal.    HISTORY:   Past Medical History:  Diagnosis Date   Abnormal pap    Allergy    Anxiety    Arthritis    back   Asthma    as a child   Atypical lobular hyperplasia (ALH) of right breast 01/08/2016   Bipolar disorder (HCC)    being treated for depression   Endometriosis    Headache    migraines   HSV-2 (herpes simplex virus 2) infection    Hypertension    Hypothyroidism    Infertility, female    Thyroid disease    hypothyroidism  COVID 19 VACCINATION STATUS: Refuses vaccination; has had COVID January 2020 and August 2022 Past Surgical  History:  Procedure Laterality Date   AUGMENTATION MAMMAPLASTY Bilateral    silicone on top of muscle   BREAST BIOPSY Left 12/12/2019   Fibrocystic changes   BREAST BIOPSY Right 12/12/2019   Fibrocystic changes with [facility]   BREAST EXCISIONAL BIOPSY Right 01/08/2020   Fibrocystic changes with [facility]   BREAST LUMPECTOMY WITH RADIOACTIVE SEED LOCALIZATION Right 01/08/2016   Procedure: RIGHT BREAST LUMPECTOMY WITH RADIOACTIVE SEED LOCALIZATION;  Surgeon: Claud Kelp, MD;  Location: [facility] OR;  Service: General;  Laterality: Right;   CESAREAN SECTION  2007   COLONOSCOPY     foot surgerey     SINUSOTOMY     Family History  Problem Relation Age of Onset   Fibromyalgia Mother    Alzheimer's disease Paternal Grandmother    Stomach cancer Paternal Grandfather 74   Breast cancer Neg Hx   The patient's mother was adopted.  She does not know her biological family.  She is 52 years old as of January 2019.  The patient's father is 37 year old as of January 2019.  Patient has 1 brother, no sisters.  There is no history of breast or ovarian cancer in the family to the patient's knowledge.  GYNECOLOGIC HISTORY:  No LMP recorded. (Menstrual status: IUD). 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  Their twins are [name] and [place], [name] as of January 2019.  The patient attends a local [address]. She was previously a Interior and spatial designer for 16 years, until 2017  Social History   Tobacco Use   Smoking status: Never   Smokeless tobacco: Never  Vaping Use   Vaping status: Never Used  Substance Use Topics   Alcohol use: Yes    Alcohol/week: 1.0 - 2.0 standard drink of alcohol    Types: 1 - 2 Standard drinks or equivalent per week    Comment: occasional, 1-2 times a week, cocktail  Drug use: Never    Colonoscopy: Age 64/ [facility]  PAP: UTD/ [facility]  Bone density: None yet   Allergies  Allergen Reactions   Sulfa Antibiotics Nausea And Vomiting    Other reaction(s): vomiting Other reaction(s): GI intolerance, Vomiting   Current Outpatient Medications  Medication Sig Dispense Refill   ALPRAZolam (XANAX) 1 MG tablet Take 1 mg by mouth daily as needed (for anxiety).      ARIPiprazole (ABILIFY) 10 MG tablet Take 10 mg by mouth at bedtime.     butalbital-acetaminophen-caffeine (FIORICET, ESGIC) 50-325-40 MG per tablet Take 1-2 tablets by mouth every 6 (six) hours as needed (for migraine headaches.).      carvedilol (COREG) 6.25 MG tablet Take 6.25 mg by mouth 2 (two) times daily.     cyanocobalamin (,VITAMIN B-12,) 1000 MCG/ML injection Inject into the muscle every 30 (thirty) days.     DEXILANT 60 MG capsule Take 1 capsule by mouth every other day.     hydrochlorothiazide (HYDRODIURIL) 25 MG tablet Take 25 mg by mouth daily.  4   levonorgestrel (MIRENA) 20 MCG/24HR IUD 1 each by Intrauterine route once.     levothyroxine (SYNTHROID, LEVOTHROID) 50 MCG tablet Take 50 mcg by mouth daily before breakfast.      promethazine (PHENERGAN) 25 MG tablet Take 25 mg by mouth every 4 (four) hours as needed (for migraine induced nausea).      rizatriptan (MAXALT) 10 MG tablet Take 10 mg by mouth daily as needed (for migraine headaches.).      valACYclovir (VALTREX) 500 MG tablet Take 500 mg by mouth daily as needed. For cold sores.  0   vortioxetine HBr (TRINTELLIX) 10 MG TABS Take 10 mg by mouth at bedtime.     VYVANSE 30 MG capsule Take 1 capsule by mouth every morning.     WEGOVY 0.5 MG/0.5ML SOAJ Inject 0.5 mg into the skin once a week.     No current facility-administered medications for this visit.       ECOG FS:1 - Symptomatic but completely ambulatory   LABS:      Component Value Date/Time   NA 136 08/14/2022 1501   NA 137 07/22/2021 0000   NA 138 02/26/2016 1218    K 3.4 (L) 08/14/2022 1501   K 3.4 (L) 02/26/2016 1218   CL 100 08/14/2022 1501   CO2 26 08/14/2022 1501   CO2 28 02/26/2016 1218   GLUCOSE 92 08/14/2022 1501   GLUCOSE 91 02/26/2016 1218   BUN 16 08/14/2022 1501   BUN 17 07/22/2021 0000   BUN 18.6 02/26/2016 1218   CREATININE 0.84 08/14/2022 1501   CREATININE 0.7 02/26/2016 1218   CALCIUM 9.2 08/14/2022 1501   CALCIUM 9.4 02/26/2016 1218   PROT 8.0 08/14/2022 1501   PROT 7.4 02/26/2016 1218   ALBUMIN 4.3 08/14/2022 1501   ALBUMIN 4.1 02/26/2016 1218   AST 39 08/14/2022 1501   AST 20 02/26/2016 1218   ALT 48 (H) 08/14/2022 1501   ALT 32 02/26/2016 1218   ALKPHOS 93 08/14/2022 1501   ALKPHOS 85 02/26/2016 1218   BILITOT 0.8 08/14/2022 1501   BILITOT 0.49 02/26/2016 1218   GFRNONAA >60 08/14/2022 1501   GFRAA >60 01/08/2016 0937   No results found for: "TOTALPROTELP", "ALBUMINELP", "A1GS", "A2GS", "BETS", "BETA2SER", "GAMS", "MSPIKE", "SPEI"  No results found for: "KPAFRELGTCHN", "LAMBDASER", "KAPLAMBRATIO"  Lab Results  Component Value Date   WBC 7.1 08/14/2022   NEUTROABS 4.3 08/14/2022   HGB 14.0 08/14/2022  HCT 43.8 08/14/2022   MCV 90.3 08/14/2022   PLT 340 08/14/2022   Component Ref Range & Units 02/25/2022  WBC 4.4 - 11.0 x 10*3/uL 6.2  Hemoglobin 12.3 - 15.3 G/DL 10.2  Hematocrit 72.5 - 44.6 % 41.4  Platelets 150 - 450 X 10*3/uL 251   Component Ref Range & Units 02/25/2022  Sodium 135 - 146 MMOL/L 137  Potassium 3.5 - 5.3 MMOL/L 3.6  Chloride 98 - 110 MMOL/L 100  CO2 21 - 31 MMOL/L 30  BUN 8 - 24 MG/DL 21  Glucose 70 - 99 MG/DL 87  Creatinine 3.66 - 4.40 MG/DL 3.47  Calcium 8.5 - 42.5 MG/DL 9.4  Total Protein 6.4 - 8.9 G/DL 7.3  Albumin 3.5 - 5.7 G/DL 4.3  Total Bilirubin 0.0 - 1.0 MG/DL 0.4  Alkaline Phosphatase 34 - 104 IU/L or U/L 85  AST (SGOT) 13 - 39 IU/L or U/L 33  ALT (SGPT) 7 - 52 IU/L or U/L 31  Anion Gap 4 - 14 MMOL/L 7   Component Ref Range & Units 07/01/2022   TSH 0.450 - 5.330 uIU/mL 2.143   Component Ref Range & Units 02/25/2022  CEA NG/ML 1.0   Component Ref Range & Units 02/25/2022  AFP <9.0 ng/mL 4.1    STUDIES  EXAM: 08/12/2022 DUAL X-RAY ABSORPTIOMETRY (DXA) FOR BONE MINERAL DENSITY  IMPRESSION: The BMD measured at Femur Neck Left is 0.862 g/cm2 with a T-score of -1.3. This patient is considered osteopenic/low bone mass according to World Health Organization [facility]) criteria. The quality of the exam is good. The lumbar spine was excluded due to degenerative changes. Site Region Measured Date Measured Age YA BMD Significant CHANGE T-score DualFemur Neck Left 08/12/2022 52.6 -1.3 0.862 g/cm2 DualFemur Total Mean 08/12/2022 52.6 -0.4 0.953 g/cm2 Left Forearm Radius 33% 08/12/2022 52.6 0.2 0.895 g/cm2  EXAM: 08/07/2022 3D DIAGNOSTIC RIGHT MAMMOGRAM POST MRI BIOPSY IMPRESSION: Barbell shaped biopsy marking clip at site of biopsied mass in the slightly inner right breast.  EXAM: 08/07/2022 MRI GUIDED CORE NEEDLE BIOPSY OF THE RIGHT BREAST IMPRESSION: MRI guided biopsy of the enhancing mass in the inner/slightly lower inner right breast. No apparent complications.  EXAM: 08/05/2022 BILATERAL BREAST MRI WITH AND WITHOUT CONTRAST IMPRESSION: 1. Indeterminate enhancing mass in the lower inner right breast measuring 0.8 cm. 2. No MRI evidence of malignancy in the left breast.  EXAM: 03/21/2022 DIGITAL SCREENING BILATERAL MAMMOGRAM WITH IMPLANTS, CAD AND TOMOSYNTHESIS IMPRESSION: No mammographic evidence of malignancy. A result letter of this screening mammogram will be mailed directly to the patient.   I,Jasmine M Lassiter,acting as a scribe for Dellia Beckwith, MD.,have documented all relevant documentation on the behalf of Dellia Beckwith, MD,as directed by  Dellia Beckwith, MD while in the presence of Dellia Beckwith, MD.

## 2022-08-11 ENCOUNTER — Encounter: Payer: Self-pay | Admitting: Specialist

## 2022-08-12 ENCOUNTER — Ambulatory Visit
Admission: RE | Admit: 2022-08-12 | Discharge: 2022-08-12 | Disposition: A | Discharge status: Home / Self Care | Payer: BC Managed Care – PPO | Source: Ambulatory Visit | Attending: Obstetrics and Gynecology | Admitting: Obstetrics and Gynecology

## 2022-08-12 DIAGNOSIS — Z79810 Long term (current) use of selective estrogen receptor modulators (SERMs): Secondary | ICD-10-CM

## 2022-08-14 ENCOUNTER — Other Ambulatory Visit: Payer: Self-pay | Admitting: Oncology

## 2022-08-14 ENCOUNTER — Inpatient Hospital Stay: Payer: BC Managed Care – PPO | Attending: Oncology

## 2022-08-14 ENCOUNTER — Inpatient Hospital Stay: Payer: BC Managed Care – PPO | Admitting: Oncology

## 2022-08-14 VITALS — BP 98/77 | HR 97 | Temp 98.4°F | Resp 16 | Ht 62.0 in | Wt 197.4 lb

## 2022-08-14 DIAGNOSIS — N6091 Unspecified benign mammary dysplasia of right breast: Secondary | ICD-10-CM | POA: Insufficient documentation

## 2022-08-14 DIAGNOSIS — Z9189 Other specified personal risk factors, not elsewhere classified: Secondary | ICD-10-CM

## 2022-08-14 DIAGNOSIS — Z1239 Encounter for other screening for malignant neoplasm of breast: Secondary | ICD-10-CM

## 2022-08-14 LAB — CMP (CANCER CENTER ONLY)
ALT: 48 U/L — ABNORMAL HIGH (ref 0–44)
AST: 39 U/L (ref 15–41)
Albumin: 4.3 g/dL (ref 3.5–5.0)
Alkaline Phosphatase: 93 U/L (ref 38–126)
Anion gap: 10 (ref 5–15)
BUN: 16 mg/dL (ref 6–20)
CO2: 26 mmol/L (ref 22–32)
Calcium: 9.2 mg/dL (ref 8.9–10.3)
Chloride: 100 mmol/L (ref 98–111)
Creatinine: 0.84 mg/dL (ref 0.44–1.00)
GFR, Estimated: >60 mL/min (ref >60)
Glucose, Bld: 92 mg/dL (ref 70–99)
Potassium: 3.4 mmol/L — ABNORMAL LOW (ref 3.5–5.1)
Sodium: 136 mmol/L (ref 135–145)
Total Bilirubin: 0.8 mg/dL (ref 0.3–1.2)
Total Protein: 8 g/dL (ref 6.5–8.1)

## 2022-08-14 LAB — CBC WITH DIFFERENTIAL (CANCER CENTER ONLY)
Abs Immature Granulocytes: 0.02 10*3/uL (ref 0.00–0.07)
Basophils Absolute: 0.1 10*3/uL (ref 0.0–0.1)
Basophils Relative: 1 %
Eosinophils Absolute: 0.2 10*3/uL (ref 0.0–0.5)
Eosinophils Relative: 3 %
HCT: 43.8 % (ref 36.0–46.0)
Hemoglobin: 14 g/dL (ref 12.0–15.0)
Immature Granulocytes: 0 %
Lymphocytes Relative: 28 %
Lymphs Abs: 2 10*3/uL (ref 0.7–4.0)
MCH: 28.9 pg (ref 26.0–34.0)
MCHC: 32 g/dL (ref 30.0–36.0)
MCV: 90.3 fL (ref 80.0–100.0)
Monocytes Absolute: 0.6 10*3/uL (ref 0.1–1.0)
Monocytes Relative: 8 %
Neutro Abs: 4.3 10*3/uL (ref 1.7–7.7)
Neutrophils Relative %: 60 %
Platelet Count: 340 10*3/uL (ref 150–400)
RBC: 4.85 MIL/uL (ref 3.87–5.11)
RDW: 13.8 % (ref 11.5–15.5)
WBC Count: 7.1 10*3/uL (ref 4.0–10.5)
nRBC: 0 % (ref 0.0–0.2)

## 2022-08-15 ENCOUNTER — Telehealth: Payer: Self-pay | Admitting: Oncology

## 2022-08-15 ENCOUNTER — Other Ambulatory Visit: Payer: Self-pay | Admitting: Oncology

## 2022-08-15 ENCOUNTER — Encounter: Payer: Self-pay | Admitting: Oncology

## 2022-08-15 ENCOUNTER — Telehealth: Payer: Self-pay

## 2022-08-15 DIAGNOSIS — Z1239 Encounter for other screening for malignant neoplasm of breast: Secondary | ICD-10-CM

## 2022-08-15 NOTE — Telephone Encounter (Signed)
Patient has been contacted and advised of lab results below. Advised to increase potassium rich foods.

## 2022-08-15 NOTE — Telephone Encounter (Signed)
-----   Message from Dellia Beckwith sent at 08/15/2022  7:08 AM EDT ----- Regarding: call Tell her K is a little low, needs to increase in her diet. One of her liver tests is mildly abnormal, rest is all normal. I will plan to repeat labs when she returns in 6 months

## 2022-08-15 NOTE — Telephone Encounter (Signed)
Patient has been scheduled. Aware of appt date and time     Scheduling Message Entered by Gery Pray H on 08/14/2022 at  3:47 PM Priority: Routine <No visit type provided>  Department: [facility]  Provider:  Scheduling Notes:  RT 6 months with breast MRI bilat

## 2022-08-18 ENCOUNTER — Telehealth: Payer: Self-pay

## 2022-08-18 NOTE — Telephone Encounter (Signed)
Referral sent 08/18/22 via fax

## 2022-08-18 NOTE — Addendum Note (Signed)
Addended by: Jeannette Corpus on: 08/18/2022 10:57 AM   Modules accepted: Orders

## 2022-08-18 NOTE — Telephone Encounter (Signed)
-----   Message from Dellia Beckwith sent at 08/14/2022  7:15 PM EDT ----- Regarding: refer Refer to Dr. Georgiana Shore regarding radial scar He is on vacation, back 7/29 so ask if he can see soon after returns

## 2022-08-19 ENCOUNTER — Other Ambulatory Visit: Payer: Self-pay | Admitting: Oncology

## 2022-08-19 DIAGNOSIS — Z1239 Encounter for other screening for malignant neoplasm of breast: Secondary | ICD-10-CM

## 2022-09-25 ENCOUNTER — Encounter: Payer: Self-pay | Admitting: Oncology

## 2023-02-09 ENCOUNTER — Ambulatory Visit
Admission: RE | Admit: 2023-02-09 | Discharge: 2023-02-09 | Disposition: A | Discharge status: Home / Self Care | Payer: BC Managed Care – PPO | Source: Ambulatory Visit | Attending: Oncology | Admitting: Oncology

## 2023-02-09 DIAGNOSIS — Z1239 Encounter for other screening for malignant neoplasm of breast: Secondary | ICD-10-CM

## 2023-02-09 MED ORDER — GADOPICLENOL 0.5 MMOL/ML IV SOLN
9.0000 mL | Freq: Once | INTRAVENOUS | Status: AC | PRN
  Administered 2023-02-09: 9 mL via INTRAVENOUS

## 2023-02-17 ENCOUNTER — Ambulatory Visit: Payer: BC Managed Care – PPO | Admitting: Oncology

## 2023-02-17 ENCOUNTER — Other Ambulatory Visit: Payer: BC Managed Care – PPO

## 2023-02-18 NOTE — Progress Notes (Signed)
[facility]  [address] [place],  [place]  [address] [phone] Patient Care Team: Jessica Clark, NP as PCP - General (Internal Medicine) Jessica Kelp, MD as Consulting Physician (General Surgery) Jessica Lay, MD as Consulting Physician (Obstetrics and Gynecology) Jessica Roberts, MD as Consulting Physician (Dermatology) Jessica Bologna, MD as Consulting Physician (Internal Medicine) Jessica Askew Jessica Hero, MD as Referring Physician  Clinic Day: 02/19/2023  Referring physician:   ASSESSMENT & PLAN  ASSESSMENT:  High-Risk for Breast Cancer Her calculated risk is 32.7% or higher over her lifetime. Her first live birth was after age 18 and she has had a breast biopsy in 2017 and again in 2024. Her family history is unknown on her mothers side. Her breast density is category C. We therefore have her on annual MRI scans alternating with annual screening mammograms, usually 6 months apart. Her current MRI is stable. She is on Tamoxifen started in March, 2019 for chemoprevention and tolerating it without difficulty.   Atypical Lobular Hyperplasia She had a biopsy and lumpectomy for this in November, 2017. The surgery showed no residual ALH.   Radial Scar This was found in mammogram of July, 2024 and she was referred to Dr. Georgiana Navarro. He preformed a lumpectomy and there were focal changes compatible with but not diagnostic of residual complex sclerosing lesion. She also had fibrocystic changes and fat necrosis but no evidence of malignancy or premalignant changes.   Osteopenia  Her last bone density scan was done in July, 2024 with a T-score of the left femur -1.3.   Nodule of the Right Breast This is in the lower outer quadrant and corresponds to the area seen on MRI scan so is likely due to the biopsy from 6 months ago. To be sure, we will schedule a diagnostic mammogram and ultrasound of the area.   PLAN: She continues Tamoxifen for  chemoprevention with good tolerance. Patient had an MRI of bilateral breast without contrast that revealed a indeterminate enhancing mass in the lower inner right breast measuring 0.8cm. She had a biopsy of the right breast done and pathology on 08/07/2022 revealed a radial scar measuring 6mm which appears completely contained within the biopsy. Pathology from the lumpectomy on 09/10/2022 revealed focal changes compatible but not diagnostic of scant residual complex sclerosing lesion, fibrocystic changes, fat necrosis, and was negative for carcinoma or in situ disease. She had a bilateral breast MRI done on 02/09/2023 that revealed mild enhancement of the right breast which has been stable, a also rim enhancing centrally fat containing mass in the anterior depth measuring 1.6 cm of the lower outer quadrant of the right breast, this is compatible with postsurgical changes but no malignancy noted in either breast. During physical exam I felt a new 1 cm nodule in the lower outer quadrant of the right breast which may correspond to the MRI finding. She is due for a annual screening mammogram in February and I will change that to bilateral diagnostic mammogram with an ultrasound of the lower outer quadrant of the right breast for further evaluation of this nodule. She has an elevated WBC of 15.4 with an ANC of 1130, hemoglobin of 12.8, and platelet count of 292,000. Her CMP is normal other than a mildly low sodium of 134. I will see her in 6 months with CBC and CMP. The patient understands the plans discussed Navarro and is in agreement with them.  She knows to contact our office if she develops concerns prior to  her next visit.   I provided 14 minutes of face-to-face time during this this encounter and > 50% was spent counseling as documented under my assessment and plan.    Jessica Beckwith, MD [facility] [address] [place]  [address] Dept: [phone] Dept Fax: [phone]   No orders of the defined types were placed in this encounter.  CHIEF COMPLAINT:  CC:   Atypical lobular hyperplasia; breast cancer high risk  Current Treatment:   Tamoxifen started 2019; intensified screening  HISTORY OF PRESENT ILLNESS:  The patient had bilateral screening mammography 11/28/2015 showing new calcifications in both breasts.  She was set up for bilateral diagnostic mammography at the breast center 12/10/2015.  This found the breast density to be category C.  There were multiple areas of scattered microcalcifications in both breasts.  The largest individual group in the right breast measured 0.5 cm in the posterior upper outer quadrant.  In the left breast the largest group spanned 0.5 cm in the outer left breast.  In general these were all felt to be most likely benign and six-month follow-up versus biopsy was suggested. The patient opted for biopsy, which was performed 12/12/2015, and showed ([id number]) in the left breast, only fibrocystic changes with calcifications.  In the right breast however there was atypical lobular hyperplasia.  The patient was referred to surgery and after appropriate discussion with Dr. Derrell Navarro she proceeded to right lumpectomy on 01/08/2016.  The final pathology here ([id number]) showed only fibrocystic changes.  Note that the biopsy site was present and the biopsy clip was identified. Her calculated her lifetime risk of breast cancer at 32.7%.   INTERVAL HISTORY:  Jessica Navarro for follow up of her high risk for breast cancer and atypical lobular hyperplasia. Her calculated her lifetime risk of breast cancer at 32.7%. Patient had an MRI of bilateral breast without contrast that revealed a indeterminate enhancing mass in the lower inner right breast measuring 0.8cm. She had a biopsy of the right breast done and pathology on 08/07/2022 revealed a radial scar measuring 6mm which appears completely  contained within the biopsy. Pathology of the lumpectomy on 09/10/2022 revealed focal changes compatible but not diagnostic of scant residual complex sclerosing lesion, fibrocystic changes, fat necrosis, and was negative for carcinoma. Patient states that she feels well and has no complaints of pain. She continues Tamoxifen for chemoprevention with good tolerance. She had a bilateral breast MRI done on 02/09/2023 that revealed mild enhancement of the right breast which has been stable, and also a rim enhancing centrally fat containing mass in the anterior depth measuring 1.6 cm of the lower outer quadrant of the right breast, this is compatible with postsurgical changes but no malignancy noted in either breast. During physical exam I felt a new 1 cm nodule in the lower outer quadrant of the right breast which may correspond to the MRI finding. She is due for a annual screening mammogram in February and I will change that to bilateral diagnostic mammogram with an ultrasound of the lower outer quadrant of the right breast for further evaluation of this nodule. She has an elevated WBC of 15.4 with an ANC of 1130, hemoglobin of 12.8, and platelet count of 292,000. Her CMP is normal besides a mildly low sodium of 134. I will see her in 6 months with CBC and CMP.  She denies signs of infection such as sore throat, sinus drainage, cough, or  urinary symptoms.  She denies fevers or recurrent chills. She denies pain. She denies nausea, vomiting, chest pain, dyspnea or cough. Her appetite is good and her weight has increased 15 pounds over last 6 ,months .   REVIEW OF SYSTEMS:  Review of Systems  Constitutional: Negative.  Negative for appetite change, chills, diaphoresis, fatigue, fever and unexpected weight change.  HENT:  Negative.  Negative for hearing loss, lump/mass, mouth sores, nosebleeds, sore throat, tinnitus, trouble swallowing and voice change.   Eyes: Negative.  Negative for eye problems and icterus.   Respiratory: Negative.  Negative for chest tightness, cough, hemoptysis, shortness of breath and wheezing.   Cardiovascular: Negative.  Negative for chest pain, leg swelling and palpitations.  Gastrointestinal: Negative.  Negative for abdominal distention, abdominal pain, blood in stool, constipation, diarrhea, nausea, rectal pain and vomiting.  Endocrine: Negative.   Genitourinary: Negative.  Negative for bladder incontinence, difficulty urinating, dyspareunia, dysuria, frequency, hematuria, menstrual problem, nocturia, pelvic pain, vaginal bleeding and vaginal discharge.   Musculoskeletal:  Negative for arthralgias, back pain, flank pain, gait problem, myalgias, neck pain and neck stiffness.  Skin: Negative.  Negative for itching, rash and wound.  Neurological:  Negative for dizziness, extremity weakness, gait problem, headaches, light-headedness, numbness, seizures and speech difficulty.  Hematological: Negative.  Negative for adenopathy. Does not bruise/bleed easily.  Psychiatric/Behavioral: Negative.  Negative for confusion, decreased concentration, depression, sleep disturbance and suicidal ideas. The patient is not nervous/anxious.    VITALS:      Body mass index is 38.83 kg/m.   Wt Readings from Last 3 Encounters:  02/19/23 212 lb 4.8 oz (96.3 kg)  08/14/22 197 lb 6.4 oz (89.5 kg)  07/22/21 176 lb 14.4 oz (80.2 kg)   PHYSICAL EXAM:  Physical Exam Vitals and nursing note reviewed.  Constitutional:      General: She is not in acute distress.    Appearance: Normal appearance. She is normal weight. She is not ill-appearing, toxic-appearing or diaphoretic.  HENT:     Head: Normocephalic and atraumatic.     Right Ear: Tympanic membrane, ear canal and external ear normal. There is no impacted cerumen.     Left Ear: Tympanic membrane, ear canal and external ear normal. There is no impacted cerumen.     Nose: Nose normal. No congestion or rhinorrhea.     Mouth/Throat:     Mouth:  Mucous membranes are moist.     Pharynx: Oropharynx is clear. No oropharyngeal exudate or posterior oropharyngeal erythema.  Eyes:     General: No scleral icterus.       Right eye: No discharge.        Left eye: No discharge.     Extraocular Movements: Extraocular movements intact.     Conjunctiva/sclera: Conjunctivae normal.     Pupils: Pupils are equal, round, and reactive to light.  Neck:     Vascular: No carotid bruit.  Cardiovascular:     Rate and Rhythm: Normal rate and regular rhythm.     Pulses: Normal pulses.     Heart sounds: Normal heart sounds. No murmur heard.    No friction rub. No gallop.  Pulmonary:     Effort: Pulmonary effort is normal. No respiratory distress.     Breath sounds: Normal breath sounds. No stridor. No wheezing, rhonchi or rales.  Chest:     Chest wall: No tenderness.     Comments: Fading scar along the inferior areolar complex of the right breast New 1cm nodule in the lower  outer quadrant of the right breast Bilateral breast implants. Well healed scar around the left areolar complex.  Abdominal:     General: Bowel sounds are normal. There is no distension.     Palpations: Abdomen is soft. There is no hepatomegaly, splenomegaly or mass.     Tenderness: There is no abdominal tenderness. There is no right CVA tenderness, left CVA tenderness, guarding or rebound.     Hernia: No hernia is present.  Musculoskeletal:        General: No swelling, tenderness, deformity or signs of injury. Normal range of motion.     Cervical back: Normal range of motion and neck supple. No rigidity or tenderness.     Right lower leg: No edema.     Left lower leg: No edema.  Lymphadenopathy:     Cervical: No cervical adenopathy.     Right cervical: No superficial, deep or posterior cervical adenopathy.    Left cervical: No superficial, deep or posterior cervical adenopathy.     Upper Body:     Right upper body: No supraclavicular, axillary or pectoral adenopathy.      Left upper body: No supraclavicular, axillary or pectoral adenopathy.  Skin:    General: Skin is warm and dry.     Coloration: Skin is not jaundiced or pale.     Findings: No bruising, erythema, lesion or rash.  Neurological:     General: No focal deficit present.     Mental Status: She is alert and oriented to person, place, and time. Mental status is at baseline.     Cranial Nerves: No cranial nerve deficit.     Sensory: No sensory deficit.     Motor: No weakness.     Coordination: Coordination normal.     Gait: Gait normal.     Deep Tendon Reflexes: Reflexes normal.  Psychiatric:        Mood and Affect: Mood normal.        Behavior: Behavior normal.        Thought Content: Thought content normal.        Judgment: Judgment normal.    HISTORY:   Past Medical History:  Diagnosis Date   Abnormal pap    Allergy    Anxiety    Arthritis    back   Asthma    as a child   Atypical lobular hyperplasia (ALH) of right breast 01/08/2016   Bipolar disorder (HCC)    being treated for depression   Endometriosis    Headache    migraines   HSV-2 (herpes simplex virus 2) infection    Hypertension    Hypothyroidism    Infertility, female    Thyroid disease    hypothyroidism  COVID 19 VACCINATION STATUS: Refuses vaccination; has had COVID January 2020 and August 2022 Past Surgical History:  Procedure Laterality Date   AUGMENTATION MAMMAPLASTY Bilateral    silicone on top of muscle   BREAST BIOPSY Left 12/12/2019   Fibrocystic changes   BREAST BIOPSY Right 12/12/2019   Fibrocystic changes with [facility]   BREAST EXCISIONAL BIOPSY Right 01/08/2020   Fibrocystic changes with [facility]   BREAST LUMPECTOMY WITH RADIOACTIVE SEED LOCALIZATION Right 01/08/2016   Procedure: RIGHT BREAST LUMPECTOMY WITH RADIOACTIVE SEED LOCALIZATION;  Surgeon: Jessica Kelp, MD;  Location: [facility] OR;  Service: General;  Laterality: Right;   CESAREAN SECTION  2007   COLONOSCOPY     foot surgerey     SINUSOTOMY      Family History  Problem  Relation Age of Onset   Fibromyalgia Mother    Alzheimer's disease Paternal Grandmother    Stomach cancer Paternal Grandfather 67   Breast cancer Neg Hx   The patient's mother was adopted.  She does not know her biological family.  She is 53 years old as of January 2019.  The patient's father is 64 year old as of January 2019.  Patient has 1 brother, no sisters.  There is no history of breast or ovarian cancer in the family to the patient's knowledge.  GYNECOLOGIC HISTORY:  No LMP recorded. (Menstrual status: IUD). 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  Their twins are [name] and [place], [name] as of January 2019.  The patient attends a local [address]. She was previously a Interior and spatial designer for 16 years, until 2017  Social History   Tobacco Use   Smoking status: Never   Smokeless tobacco: Never  Vaping Use   Vaping status: Never Used  Substance Use Topics   Alcohol use: Yes    Alcohol/week: 1.0 - 2.0 standard drink of alcohol    Types: 1 - 2 Standard drinks or equivalent per week    Comment: occasional, 1-2 times a week, cocktail    Drug use: Never    Colonoscopy: Age 32/ [facility]  PAP: UTD/ [facility]  Bone density: None yet   Allergies  Allergen Reactions   Sulfa Antibiotics Nausea And Vomiting    Other reaction(s): vomiting Other reaction(s): GI intolerance, Vomiting   Current Outpatient Medications  Medication Sig Dispense Refill   omeprazole (PRILOSEC) 40 MG capsule Take 1 capsule by mouth 20 minutes before breakfast     ondansetron (ZOFRAN-ODT) 4 MG disintegrating tablet Take 4 mg by mouth every 6 (six) hours as needed.     ALPRAZolam (XANAX) 1 MG tablet Take 1 mg by mouth daily as needed (for anxiety).       ARIPiprazole (ABILIFY) 5 MG tablet Take 1 tablet by mouth once nightly.     butalbital-acetaminophen-caffeine (FIORICET, ESGIC) 50-325-40 MG per tablet Take 1-2 tablets by mouth every 6 (six) hours as needed (for migraine headaches.).      carvedilol (COREG) 25 MG tablet Take 25 mg by mouth 2 (two) times daily.     cyanocobalamin (,VITAMIN B-12,) 1000 MCG/ML injection Inject into the muscle every 30 (thirty) days.     hydrochlorothiazide (HYDRODIURIL) 25 MG tablet Take 25 mg by mouth daily.  4   levonorgestrel (MIRENA) 20 MCG/24HR IUD 1 each by Intrauterine route once.     levothyroxine (SYNTHROID, LEVOTHROID) 50 MCG tablet Take 50 mcg by mouth daily before breakfast.      losartan (COZAAR) 25 MG tablet Take by mouth.     promethazine (PHENERGAN) 25 MG tablet Take 25 mg by mouth every 4 (four) hours as needed (for migraine induced nausea).      rizatriptan (MAXALT) 10 MG tablet Take 10 mg by mouth daily as needed (for migraine headaches.).      valACYclovir (VALTREX) 500 MG tablet Take 500 mg by mouth daily as needed. For cold sores.  0   vortioxetine HBr (TRINTELLIX) 10 MG TABS Take 10 mg by mouth at bedtime.     No current facility-administered medications for this visit.       ECOG FS:1 - Symptomatic  but completely ambulatory   LABS:   Lab Results  Component Value Date   WBC 15.4 (H) 02/19/2023   HGB 12.8 02/19/2023   HCT 39.4 02/19/2023   MCV 93.1 02/19/2023   PLT 292 02/19/2023   Lab Results  Component Value Date   CREATININE 0.76 02/19/2023   BUN 21 (H) 02/19/2023   NA 134 (L) 02/19/2023   K 3.6 02/19/2023   CL 98 02/19/2023   CO2 22 02/19/2023      Component Value Date/Time   PROT 6.7 02/19/2023 1402   PROT 7.4 02/26/2016 1218   ALBUMIN 3.9 02/19/2023 1402   ALBUMIN 4.1 02/26/2016 1218   AST 30 02/19/2023 1402   AST 20 02/26/2016 1218   ALT 38 02/19/2023 1402   ALT 32 02/26/2016 1218   ALKPHOS 120 02/19/2023 1402   ALKPHOS 85 02/26/2016 1218   BILITOT 0.4  02/19/2023 1402   BILITOT 0.49 02/26/2016 1218  No results found for: "IRON", "TIBC", "FERRITIN" Lab Results  Component Value Date   TSH 0.55 09/05/2013     STUDIES  EXAM: 02/09/2023 BILATERAL BREAST MRI WITH AND WITHOUT CONTRAST IMPRESSION: No MRI evidence of malignancy in the bilateral breasts. RECOMMENDATION: 1.  Annual screening mammography is due in February of 2025. 2.  Annual high risk screening MRI in 1 year. BI-RADS CATEGORY  2: Benign.  EXAM: 08/12/2022 DG Bone Density The BMD measured at Femur Neck Left is 0.862 g/cm2 with a T-score of -1.3. This patient is considered osteopenic/low bone mass according to World Health Organization [facility]) criteria. The quality of the exam is good. The lumbar spine was excluded due to degenerative changes. Site Region Measured Date Measured Age YA BMD Significant CHANGE T-score DualFemur Neck Left 08/12/2022 52.6 -1.3 0.862 g/cm2 DualFemur Total Mean 08/12/2022 52.6 -0.4 0.953 g/cm2 Left Forearm Radius 33% 08/12/2022 52.6 0.2 0.895 g/cm2    I,Jasmine M Lassiter,acting as a scribe for Jessica Beckwith, MD.,have documented all relevant documentation on the behalf of Jessica Beckwith, MD,as directed by  Jessica Beckwith, MD while in the presence of Jessica Beckwith, MD.

## 2023-02-19 ENCOUNTER — Inpatient Hospital Stay: Payer: BC Managed Care – PPO | Admitting: Oncology

## 2023-02-19 ENCOUNTER — Other Ambulatory Visit: Payer: Self-pay | Admitting: Oncology

## 2023-02-19 ENCOUNTER — Encounter: Payer: Self-pay | Admitting: Oncology

## 2023-02-19 ENCOUNTER — Inpatient Hospital Stay: Payer: BC Managed Care – PPO | Attending: Oncology

## 2023-02-19 VITALS — BP 120/79 | HR 86 | Temp 97.7°F | Resp 16 | Ht 62.0 in | Wt 212.3 lb

## 2023-02-19 DIAGNOSIS — Z1239 Encounter for other screening for malignant neoplasm of breast: Secondary | ICD-10-CM

## 2023-02-19 DIAGNOSIS — Z9189 Other specified personal risk factors, not elsewhere classified: Secondary | ICD-10-CM

## 2023-02-19 DIAGNOSIS — Z79810 Long term (current) use of selective estrogen receptor modulators (SERMs): Secondary | ICD-10-CM | POA: Diagnosis not present

## 2023-02-19 DIAGNOSIS — M8580 Other specified disorders of bone density and structure, unspecified site: Secondary | ICD-10-CM | POA: Insufficient documentation

## 2023-02-19 DIAGNOSIS — N6091 Unspecified benign mammary dysplasia of right breast: Secondary | ICD-10-CM

## 2023-02-19 DIAGNOSIS — N63 Unspecified lump in unspecified breast: Secondary | ICD-10-CM

## 2023-02-19 LAB — CBC WITH DIFFERENTIAL (CANCER CENTER ONLY)
Abs Immature Granulocytes: 0.08 10*3/uL — ABNORMAL HIGH (ref 0.00–0.07)
Basophils Absolute: 0.1 10*3/uL (ref 0.0–0.1)
Basophils Relative: 0 %
Eosinophils Absolute: 0.2 10*3/uL (ref 0.0–0.5)
Eosinophils Relative: 1 %
HCT: 39.4 % (ref 36.0–46.0)
Hemoglobin: 12.8 g/dL (ref 12.0–15.0)
Immature Granulocytes: 1 %
Lymphocytes Relative: 19 %
Lymphs Abs: 2.9 10*3/uL (ref 0.7–4.0)
MCH: 30.3 pg (ref 26.0–34.0)
MCHC: 32.5 g/dL (ref 30.0–36.0)
MCV: 93.1 fL (ref 80.0–100.0)
Monocytes Absolute: 0.9 10*3/uL (ref 0.1–1.0)
Monocytes Relative: 6 %
Neutro Abs: 11.3 10*3/uL — ABNORMAL HIGH (ref 1.7–7.7)
Neutrophils Relative %: 73 %
Platelet Count: 292 10*3/uL (ref 150–400)
RBC: 4.23 MIL/uL (ref 3.87–5.11)
RDW: 14.2 % (ref 11.5–15.5)
WBC Count: 15.4 10*3/uL — ABNORMAL HIGH (ref 4.0–10.5)
nRBC: 0 % (ref 0.0–0.2)
nRBC: 0 /100{WBCs}

## 2023-02-19 LAB — CMP (CANCER CENTER ONLY)
ALT: 38 U/L (ref 0–44)
AST: 30 U/L (ref 15–41)
Albumin: 3.9 g/dL (ref 3.5–5.0)
Alkaline Phosphatase: 120 U/L (ref 38–126)
Anion gap: 14 (ref 5–15)
BUN: 21 mg/dL — ABNORMAL HIGH (ref 6–20)
CO2: 22 mmol/L (ref 22–32)
Calcium: 9.4 mg/dL (ref 8.9–10.3)
Chloride: 98 mmol/L (ref 98–111)
Creatinine: 0.76 mg/dL (ref 0.44–1.00)
GFR, Estimated: >60 mL/min (ref >60)
Glucose, Bld: 119 mg/dL — ABNORMAL HIGH (ref 70–99)
Potassium: 3.6 mmol/L (ref 3.5–5.1)
Sodium: 134 mmol/L — ABNORMAL LOW (ref 135–145)
Total Bilirubin: 0.4 mg/dL (ref 0.0–1.2)
Total Protein: 6.7 g/dL (ref 6.5–8.1)

## 2023-02-20 ENCOUNTER — Telehealth: Payer: Self-pay

## 2023-02-20 NOTE — Telephone Encounter (Signed)
Patient notified of message and mammogram is scheduled either February 12 or 13 at the [facility] in [place].

## 2023-02-20 NOTE — Telephone Encounter (Signed)
-----   Message from Dellia Beckwith sent at 02/19/2023  5:11 PM EST ----- Regarding: call Tell Jessica Navarro her labs overall look good but her salt level is low mildly low.  Blood sugar good at 118.  Her white count is elevated so we will look for signs of infection.  After reviewing the MRI and the biopsy from last year, I think the nodule we are feeling is probably from that biopsy.  I still recommend a full evaluation however

## 2023-02-25 ENCOUNTER — Ambulatory Visit
Admission: RE | Admit: 2023-02-25 | Discharge: 2023-02-25 | Discharge status: Home / Self Care | Payer: BC Managed Care – PPO | Source: Ambulatory Visit | Attending: Oncology | Admitting: Oncology

## 2023-02-25 ENCOUNTER — Ambulatory Visit
Admission: RE | Admit: 2023-02-25 | Discharge: 2023-02-25 | Disposition: A | Discharge status: Home / Self Care | Payer: BC Managed Care – PPO | Source: Ambulatory Visit | Attending: Oncology | Admitting: Oncology

## 2023-02-25 DIAGNOSIS — N63 Unspecified lump in unspecified breast: Secondary | ICD-10-CM

## 2023-03-10 ENCOUNTER — Telehealth: Payer: Self-pay

## 2023-03-10 NOTE — Telephone Encounter (Signed)
-----   Message from Dellia Beckwith sent at 03/02/2023 12:58 PM EST ----- Regarding: call The area we feel does appear benign on mammo and [place] so will just follow & examine periodically. Let her know. She will be due for yearly screening mammo in February, definitely of the 1 side, not sure if radiology is rec  a repeat of the side we just did.  Can you call and ask?

## 2023-03-10 NOTE — Telephone Encounter (Signed)
Attempted to contact patient. No answer.

## 2023-03-11 ENCOUNTER — Other Ambulatory Visit: Payer: BC Managed Care – PPO

## 2023-05-07 ENCOUNTER — Other Ambulatory Visit: Payer: Self-pay | Admitting: Specialist

## 2023-05-07 DIAGNOSIS — Z1231 Encounter for screening mammogram for malignant neoplasm of breast: Secondary | ICD-10-CM

## 2023-05-13 ENCOUNTER — Ambulatory Visit
Admission: RE | Admit: 2023-05-13 | Discharge: 2023-05-13 | Disposition: A | Discharge status: Home / Self Care | Source: Ambulatory Visit | Attending: Specialist | Admitting: Specialist

## 2023-05-13 DIAGNOSIS — Z1231 Encounter for screening mammogram for malignant neoplasm of breast: Secondary | ICD-10-CM

## 2023-07-20 ENCOUNTER — Other Ambulatory Visit: Payer: BC Managed Care – PPO

## 2023-08-18 NOTE — Progress Notes (Signed)
 [facility]  [address] [place],  [place]  [address] [phone] Patient Care Team: Benson Eleanor Rung, NP as PCP - General (Internal Medicine) Gail Favorite, MD as Consulting Physician (General Surgery) Darcel Pool, MD as Consulting Physician (Obstetrics and Gynecology) Ivin Kocher, MD as Consulting Physician (Dermatology) Charlanne Groom, MD as Consulting Physician (Internal Medicine) Marieta Fairy Mt, MD as Referring Physician  Clinic Day: 08/19/23    Referring physician:   ASSESSMENT & PLAN  ASSESSMENT:  High-Risk for Breast Cancer Her calculated risk is 32.7% or higher over her lifetime. Her first live birth was after age 46 and she has had a breast biopsy in 2017 and again in 2024. Her family history is unknown on her mother's side. Her breast density is category C. We therefore have her on annual MRI scans alternating with annual screening mammograms, usually 6 months apart. Her current MRI is stable. She was on Tamoxifen , started in March, 2019 for chemoprevention and completed in 2024.   Atypical Lobular Hyperplasia She had a biopsy and lumpectomy for this in November, 2017. The surgery showed no residual ALH.   Radial Scar This was found in mammogram of July, 2024 and she was referred to Dr. Bert. He performed a lumpectomy and there were focal changes compatible with but not diagnostic of residual complex sclerosing lesion. She also had fibrocystic changes and fat necrosis but no evidence of malignancy or premalignant changes.   Osteopenia  Her last bone density scan was done in July, 2024 with a T-score of the left femur -1.3.   Nodule of the Right Breast This is in the lower outer quadrant and corresponds to the area seen on MRI scan so is likely due to the biopsy from 6 months ago. MRI of bilateral breast in July, 2024 revealed a indeterminate enhancing mass in the lower inner right breast measuring 0.8 cm. She had a biopsy of the  right breast done and pathology on 08/07/2022 revealed a radial scar measuring 6 mm which appears completely contained within the biopsy. Pathology from the lumpectomy on 09/10/2022 revealed focal changes compatible but not diagnostic of scant residual complex sclerosing lesion, fibrocystic changes, fat necrosis, and was negative for carcinoma or in situ disease. MRI done on 02/09/2023 that revealed mild enhancement of the right breast which has been stable, also a rim enhancing centrally fat containing mass in the anterior depth measuring 1.6 cm of the lower outer quadrant of the right breast, this is compatible with postsurgical changes but no malignancy noted in either breast. Diagnostic mammogram done on 02/25/2023  revealed a 1.8 cm fat filled mass at the patient's surgical site in the lateral right breast consistent with benign fat necrosis. We will monitor this.   Plan: She completed 5 years of Tamoxifen  in Spring, 2024. She had a right breast diagnostic mammogram done on 02/25/2023 to further evaluate a right breast lump she felt. This revealed a 1.8 cm fat filled mass at the patient's surgical site in the lateral right breast consistent with benign fat necrosis. I will schedule a bilateral diagnostic mammogram now and she will be due for an bilateral MRI of the breast in January. She has a WBC of 6.2, hemoglobin of 13.6, platelet count of 276,000, and her CMP is completely normal. I will see her back in 6 months with bilateral breast MRI. The patient understands the plans discussed today and is in agreement with them.  She knows to contact our office if she develops concerns prior  to her next visit.   I provided 12 minutes of face-to-face time during this this encounter and > 50% was spent counseling as documented under my assessment and plan.    Wanda VEAR Cornish, MD  [facility] A DEPT OF [facility] [address] [place]  [address] Dept: [phone] Dept Fax: [phone]    No orders of the defined types were placed in this encounter.  CHIEF COMPLAINT:  CC:   Atypical lobular hyperplasia; breast cancer high risk  Current Treatment:   Tamoxifen  started 2019; intensified screening  HISTORY OF PRESENT ILLNESS:  The patient had bilateral screening mammography 11/28/2015 showing new calcifications in both breasts.  She was set up for bilateral diagnostic mammography at the breast center 12/10/2015.  This found the breast density to be category C.  There were multiple areas of scattered microcalcifications in both breasts.  The largest individual group in the right breast measured 0.5 cm in the posterior upper outer quadrant.  In the left breast the largest group spanned 0.5 cm in the outer left breast.  In general these were all felt to be most likely benign and six-month follow-up versus biopsy was suggested. The patient opted for biopsy, which was performed 12/12/2015, and showed ([id number]) in the left breast, only fibrocystic changes with calcifications.  In the right breast however there was atypical lobular hyperplasia.  The patient was referred to surgery and after appropriate discussion with Dr. Gail she proceeded to right lumpectomy on 01/08/2016.  The final pathology here ([id number]) showed only fibrocystic changes.  Note that the biopsy site was present and the biopsy clip was identified. Her calculated her lifetime risk of breast cancer at 32.7%.   INTERVAL HISTORY:  Jessica Navarro returns today for follow up of her high risk for breast cancer and atypical lobular hyperplasia. Her calculated her lifetime risk of breast cancer is 32.7%. Patient had an MRI of bilateral breast without contrast that revealed a indeterminate enhancing mass in the lower inner right breast measuring 0.8 cm. She had a biopsy of the right breast done and pathology on 08/07/2022 revealed a radial scar measuring 6 mm which appears  completely contained within the biopsy. Pathology of the lumpectomy on 09/10/2022 revealed focal changes compatible but not diagnostic of scant residual complex sclerosing lesion, fibrocystic changes, fat necrosis, and was negative for carcinoma. Patient states that she feels well and has no complaints of pain. She completed 5 years of Tamoxifen  in Spring, 2024. She had a right breast diagnostic mammogram done on 02/25/2023 to further evaluate a right breast lump she felt. This revealed a 1.8 cm fat filled mass at the patient's surgical site in the lateral right breast consistent with benign fat necrosis. I will schedule a bilateral diagnostic mammogram now and she will be due for a bilateral MRI of the breast in January. She has a WBC of 6.2, hemoglobin of 13.6, platelet count of 276,000, and her CMP is completely normal. I will see her back in 6 months with bilateral breast MRI. She denies fever, chills, night sweats, or other signs of infection. She denies cardiorespiratory and gastrointestinal issues. She  denies pain. Her appetite is good and Her weight has decreased 57 pounds over last 6 months. She is on Zepbound 12.5 mg and has cut out high fat foods from her diet.   REVIEW OF SYSTEMS:  Review of Systems  Constitutional: Negative.  Negative for appetite change, chills, diaphoresis,  fatigue, fever and unexpected weight change.  HENT:  Negative.  Negative for hearing loss, lump/mass, mouth sores, nosebleeds, sore throat, tinnitus, trouble swallowing and voice change.   Eyes: Negative.  Negative for eye problems and icterus.  Respiratory: Negative.  Negative for chest tightness, cough, hemoptysis, shortness of breath and wheezing.   Cardiovascular: Negative.  Negative for chest pain, leg swelling and palpitations.  Gastrointestinal: Negative.  Negative for abdominal distention, abdominal pain, blood in stool, constipation, diarrhea, nausea, rectal pain and vomiting.  Endocrine: Negative.    Genitourinary: Negative.  Negative for bladder incontinence, difficulty urinating, dyspareunia, dysuria, frequency, hematuria, menstrual problem, nocturia, pelvic pain, vaginal bleeding and vaginal discharge.   Musculoskeletal:  Negative for arthralgias, back pain, flank pain, gait problem, myalgias, neck pain and neck stiffness.  Skin: Negative.  Negative for itching, rash and wound.  Neurological:  Negative for dizziness, extremity weakness, gait problem, headaches, light-headedness, numbness, seizures and speech difficulty.  Hematological: Negative.  Negative for adenopathy. Does not bruise/bleed easily.  Psychiatric/Behavioral: Negative.  Negative for confusion, decreased concentration, depression, sleep disturbance and suicidal ideas. The patient is not nervous/anxious.    VITALS:   Vitals:   08/19/23 0909  BP: 113/71  Pulse: 63  Resp: 18  Temp: 97.7 F (36.5 C)  SpO2: 100%   Body mass index is 28.51 kg/m.   Wt Readings from Last 3 Encounters:  08/19/23 155 lb 14.4 oz (70.7 kg)  02/19/23 212 lb 4.8 oz (96.3 kg)  08/14/22 197 lb 6.4 oz (89.5 kg)   Performance status (ECOG): 0 - Asymptomatic  PHYSICAL EXAM:  Physical Exam Vitals and nursing note reviewed.  Constitutional:      General: She is not in acute distress.    Appearance: Normal appearance. She is normal weight. She is not ill-appearing, toxic-appearing or diaphoretic.  HENT:     Head: Normocephalic and atraumatic.     Right Ear: Tympanic membrane, ear canal and external ear normal. There is no impacted cerumen.     Left Ear: Tympanic membrane, ear canal and external ear normal. There is no impacted cerumen.     Nose: Nose normal. No congestion or rhinorrhea.     Mouth/Throat:     Mouth: Mucous membranes are moist.     Pharynx: Oropharynx is clear. No oropharyngeal exudate or posterior oropharyngeal erythema.  Eyes:     General: No scleral icterus.       Right eye: No discharge.        Left eye: No discharge.      Extraocular Movements: Extraocular movements intact.     Conjunctiva/sclera: Conjunctivae normal.     Pupils: Pupils are equal, round, and reactive to light.  Neck:     Vascular: No carotid bruit.  Cardiovascular:     Rate and Rhythm: Normal rate and regular rhythm.     Pulses: Normal pulses.     Heart sounds: Normal heart sounds. No murmur heard.    No friction rub. No gallop.  Pulmonary:     Effort: Pulmonary effort is normal. No respiratory distress.     Breath sounds: Normal breath sounds. No stridor. No wheezing, rhonchi or rales.  Chest:     Chest wall: No tenderness.     Comments: Palpable nodule 1-2cm in the lower outer quadrant of the right breast in the subcutaneous tissue. (Firm and irregular) Bilateral implants are in place and appear normal.  No other masses in either breasts Abdominal:     General: Bowel sounds are normal.  There is no distension.     Palpations: Abdomen is soft. There is no hepatomegaly, splenomegaly or mass.     Tenderness: There is no abdominal tenderness. There is no right CVA tenderness, left CVA tenderness, guarding or rebound.     Hernia: No hernia is present.  Musculoskeletal:        General: No swelling, tenderness, deformity or signs of injury. Normal range of motion.     Cervical back: Normal range of motion and neck supple. No rigidity or tenderness.     Right lower leg: No edema.     Left lower leg: No edema.  Lymphadenopathy:     Cervical: No cervical adenopathy.     Right cervical: No superficial, deep or posterior cervical adenopathy.    Left cervical: No superficial, deep or posterior cervical adenopathy.     Upper Body:     Right upper body: No supraclavicular, axillary or pectoral adenopathy.     Left upper body: No supraclavicular, axillary or pectoral adenopathy.  Skin:    General: Skin is warm and dry.     Coloration: Skin is not jaundiced or pale.     Findings: No bruising, erythema, lesion or rash.  Neurological:      General: No focal deficit present.     Mental Status: She is alert and oriented to person, place, and time. Mental status is at baseline.     Cranial Nerves: No cranial nerve deficit.     Sensory: No sensory deficit.     Motor: No weakness.     Coordination: Coordination normal.     Gait: Gait normal.     Deep Tendon Reflexes: Reflexes normal.  Psychiatric:        Mood and Affect: Mood normal.        Behavior: Behavior normal.        Thought Content: Thought content normal.        Judgment: Judgment normal.    HISTORY:   Past Medical History:  Diagnosis Date   Abnormal pap    Allergy    Anxiety    Arthritis    back   Asthma    as a child   Atypical lobular hyperplasia (ALH) of right breast 01/08/2016   Bipolar disorder (HCC)    being treated for depression   Endometriosis    Headache    migraines   HSV-2 (herpes simplex virus 2) infection    Hypertension    Hypothyroidism    Infertility, female    Thyroid  disease    hypothyroidism  COVID 19 VACCINATION STATUS: Refuses vaccination; has had COVID January 2020 and August 2022 Past Surgical History:  Procedure Laterality Date   AUGMENTATION MAMMAPLASTY Bilateral    silicone on top of muscle   BREAST BIOPSY Left 12/12/2019   Fibrocystic changes   BREAST BIOPSY Right 12/12/2019   Fibrocystic changes with [facility]   BREAST EXCISIONAL BIOPSY Right 01/08/2020   Fibrocystic changes with [facility]   BREAST LUMPECTOMY WITH RADIOACTIVE SEED LOCALIZATION Right 01/08/2016   Procedure: RIGHT BREAST LUMPECTOMY WITH RADIOACTIVE SEED LOCALIZATION;  Surgeon: Elon Pacini, MD;  Location: [facility] OR;  Service: General;  Laterality: Right;   CESAREAN SECTION  2007   COLONOSCOPY     foot surgerey     SINUSOTOMY     Family History  Problem Relation Age of Onset   Fibromyalgia Mother    Alzheimer's disease Paternal Grandmother    Stomach cancer Paternal Grandfather 2   Breast cancer Neg Hx  The patient's mother was adopted.  She does not know  her biological family.  She is 53 years old as of January 2019.  The patient's father is 32 year old as of January 2019.  Patient has 1 brother, no sisters.  There is no history of breast or ovarian cancer in the family to the patient's knowledge.  GYNECOLOGIC HISTORY:  No LMP recorded. (Menstrual status: IUD). 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  Their twins are Marit and [place]ham, [name] as of January 2019.  The patient attends a local [address]. She was previously a Interior and spatial designer for 16 years, until 2017  Social History   Tobacco Use   Smoking status: Never   Smokeless tobacco: Never  Vaping Use   Vaping status: Never Used  Substance Use Topics   Alcohol use: Yes    Alcohol/week: 1.0 - 2.0 standard drink of alcohol    Types: 1 - 2 Standard drinks or equivalent per week    Comment: occasional, 1-2 times a week, cocktail    Drug use: Never    Colonoscopy: Age 59/ [facility]  PAP: UTD/ [facility]  Bone density: None yet   Allergies  Allergen Reactions   Sulfa Antibiotics Nausea And Vomiting and Other (See Comments)    Other reaction(s): vomiting  Other reaction(s): GI intolerance, Vomiting   Current Outpatient Medications  Medication Sig Dispense Refill   ALPRAZolam (XANAX) 1 MG tablet Take 1 mg by mouth daily as needed (for anxiety).      ARIPiprazole (ABILIFY) 5 MG tablet Take 1 tablet by mouth once nightly.     butalbital-acetaminophen -caffeine (FIORICET, ESGIC) 50-325-40 MG per tablet Take 1-2 tablets by mouth every 6 (six) hours as needed (for migraine headaches.).      carvedilol (COREG) 3.125 MG tablet Take 3.125 mg by mouth 2 (two) times daily.     cyanocobalamin (,VITAMIN B-12,) 1000 MCG/ML injection Inject into the muscle every 30 (thirty) days.      levonorgestrel (MIRENA) 20 MCG/24HR IUD 1 each by Intrauterine route once.     levothyroxine (SYNTHROID) 75 MCG tablet Take 75 mcg by mouth daily.     omeprazole (PRILOSEC) 40 MG capsule Take 1 capsule by mouth 20 minutes before breakfast     ondansetron  (ZOFRAN -ODT) 4 MG disintegrating tablet Take 4 mg by mouth every 6 (six) hours as needed.     promethazine (PHENERGAN) 25 MG tablet Take 25 mg by mouth every 4 (four) hours as needed (for migraine induced nausea).      QULIPTA 60 MG TABS Take 1 tablet by mouth daily.     rizatriptan (MAXALT) 10 MG tablet Take 10 mg by mouth daily as needed (for migraine headaches.).      valACYclovir (VALTREX) 500 MG tablet Take 500 mg by mouth daily as needed. For cold sores.  0   vortioxetine HBr (TRINTELLIX) 10 MG TABS Take 10 mg by mouth at bedtime.     ZEPBOUND 12.5 MG/0.5ML Pen SMARTSIG:0.5 Milliliter(s) SUB-Q Once a Week     No current facility-administered medications for this visit.   LABS:   Lab Results  Component Value Date   WBC 6.2 08/19/2023   HGB 13.6 08/19/2023   HCT 43.0 08/19/2023   MCV 94.3 08/19/2023   PLT 276 08/19/2023  Lab Results  Component Value Date   CREATININE 0.71 08/19/2023   BUN 17 08/19/2023   NA 139 08/19/2023   K 4.5 08/19/2023   CL 103 08/19/2023   CO2 27 08/19/2023      Component Value Date/Time   PROT 6.9 08/19/2023 0823   PROT 7.4 02/26/2016 1218   ALBUMIN 4.1 08/19/2023 0823   ALBUMIN 4.1 02/26/2016 1218   AST 15 08/19/2023 0823   AST 20 02/26/2016 1218   ALT 12 08/19/2023 0823   ALT 32 02/26/2016 1218   ALKPHOS 83 08/19/2023 0823   ALKPHOS 85 02/26/2016 1218   BILITOT 0.3 08/19/2023 0823   BILITOT 0.49 02/26/2016 1218  No results found for: IRON, TIBC, FERRITIN Lab Results  Component Value Date   TSH 0.55 09/05/2013   THYROIDAB <10.0 03/07/2013   STUDIES  EXAM: 05/13/2023 DIGITAL SCREENING BILATERAL MAMMOGRAM WITH IMPLANTS, CAD AND TOMOSYNTHESIS IMPRESSION: No mammographic  evidence of malignancy. A result letter of this screening mammogram will be mailed directly to the patient. EXAM: 02/09/2023 BILATERAL BREAST MRI WITH AND WITHOUT CONTRAST IMPRESSION: No MRI evidence of malignancy in the bilateral breasts. RECOMMENDATION: 1.  Annual screening mammography is due in February of 2025. 2.  Annual high risk screening MRI in 1 year. BI-RADS CATEGORY  2: Benign.  EXAM: 08/12/2022 DG Bone Density The BMD measured at Femur Neck Left is 0.862 g/cm2 with a T-score of -1.3. This patient is considered osteopenic/low bone mass according to World Health Organization [facility]) criteria. The quality of the exam is good. The lumbar spine was excluded due to degenerative changes. Site Region Measured Date Measured Age YA BMD Significant CHANGE T-score DualFemur Neck Left 08/12/2022 52.6 -1.3 0.862 g/cm2 DualFemur Total Mean 08/12/2022 52.6 -0.4 0.953 g/cm2 Left Forearm Radius 33% 08/12/2022 52.6 0.2 0.895 g/cm2    I,Jasmine M Lassiter,acting as a scribe for Wanda VEAR Cornish, MD.,have documented all relevant documentation on the behalf of Wanda VEAR Cornish, MD,as directed by  Wanda VEAR Cornish, MD while in the presence of Wanda VEAR Cornish, MD.

## 2023-08-19 ENCOUNTER — Inpatient Hospital Stay: Payer: BC Managed Care – PPO | Attending: Oncology | Admitting: Oncology

## 2023-08-19 ENCOUNTER — Other Ambulatory Visit: Payer: Self-pay | Admitting: Oncology

## 2023-08-19 ENCOUNTER — Inpatient Hospital Stay: Payer: BC Managed Care – PPO

## 2023-08-19 ENCOUNTER — Telehealth: Payer: Self-pay | Admitting: Oncology

## 2023-08-19 ENCOUNTER — Encounter: Payer: Self-pay | Admitting: Oncology

## 2023-08-19 VITALS — BP 113/71 | HR 63 | Temp 97.7°F | Resp 18 | Ht 62.0 in | Wt 155.9 lb

## 2023-08-19 DIAGNOSIS — M8580 Other specified disorders of bone density and structure, unspecified site: Secondary | ICD-10-CM | POA: Insufficient documentation

## 2023-08-19 DIAGNOSIS — N6313 Unspecified lump in the right breast, lower outer quadrant: Secondary | ICD-10-CM | POA: Diagnosis present

## 2023-08-19 DIAGNOSIS — Z9189 Other specified personal risk factors, not elsewhere classified: Secondary | ICD-10-CM | POA: Diagnosis present

## 2023-08-19 DIAGNOSIS — N6489 Other specified disorders of breast: Secondary | ICD-10-CM | POA: Insufficient documentation

## 2023-08-19 DIAGNOSIS — Z1239 Encounter for other screening for malignant neoplasm of breast: Secondary | ICD-10-CM

## 2023-08-19 DIAGNOSIS — F1090 Alcohol use, unspecified, uncomplicated: Secondary | ICD-10-CM | POA: Insufficient documentation

## 2023-08-19 DIAGNOSIS — N6091 Unspecified benign mammary dysplasia of right breast: Secondary | ICD-10-CM

## 2023-08-19 LAB — CMP (CANCER CENTER ONLY)
ALT: 12 U/L (ref 0–44)
AST: 15 U/L (ref 15–41)
Albumin: 4.1 g/dL (ref 3.5–5.0)
Alkaline Phosphatase: 83 U/L (ref 38–126)
Anion gap: 9 (ref 5–15)
BUN: 17 mg/dL (ref 6–20)
CO2: 27 mmol/L (ref 22–32)
Calcium: 9.4 mg/dL (ref 8.9–10.3)
Chloride: 103 mmol/L (ref 98–111)
Creatinine: 0.71 mg/dL (ref 0.44–1.00)
GFR, Estimated: >60 mL/min (ref >60)
Glucose, Bld: 88 mg/dL (ref 70–99)
Potassium: 4.5 mmol/L (ref 3.5–5.1)
Sodium: 139 mmol/L (ref 135–145)
Total Bilirubin: 0.3 mg/dL (ref 0.0–1.2)
Total Protein: 6.9 g/dL (ref 6.5–8.1)

## 2023-08-19 LAB — CBC WITH DIFFERENTIAL (CANCER CENTER ONLY)
Abs Immature Granulocytes: 0.01 K/uL (ref 0.00–0.07)
Basophils Absolute: 0 K/uL (ref 0.0–0.1)
Basophils Relative: 0 %
Eosinophils Absolute: 0.2 K/uL (ref 0.0–0.5)
Eosinophils Relative: 3 %
HCT: 43 % (ref 36.0–46.0)
Hemoglobin: 13.6 g/dL (ref 12.0–15.0)
Immature Granulocytes: 0 %
Lymphocytes Relative: 22 %
Lymphs Abs: 1.4 K/uL (ref 0.7–4.0)
MCH: 29.8 pg (ref 26.0–34.0)
MCHC: 31.6 g/dL (ref 30.0–36.0)
MCV: 94.3 fL (ref 80.0–100.0)
Monocytes Absolute: 0.6 K/uL (ref 0.1–1.0)
Monocytes Relative: 10 %
Neutro Abs: 4.1 K/uL (ref 1.7–7.7)
Neutrophils Relative %: 65 %
Platelet Count: 276 K/uL (ref 150–400)
RBC: 4.56 MIL/uL (ref 3.87–5.11)
RDW: 15 % (ref 11.5–15.5)
WBC Count: 6.2 K/uL (ref 4.0–10.5)
nRBC: 0 % (ref 0.0–0.2)

## 2023-08-19 NOTE — Telephone Encounter (Signed)
 Patient has been scheduled for follow-up visit per 08/19/23 LOS.  Pt given an appt calendar with date and time.

## 2023-08-30 ENCOUNTER — Encounter: Payer: Self-pay | Admitting: Oncology

## 2023-10-19 ENCOUNTER — Other Ambulatory Visit: Payer: Self-pay | Admitting: Oncology

## 2023-10-19 DIAGNOSIS — Z1239 Encounter for other screening for malignant neoplasm of breast: Secondary | ICD-10-CM

## 2023-10-19 DIAGNOSIS — N6311 Unspecified lump in the right breast, upper outer quadrant: Secondary | ICD-10-CM

## 2023-10-20 ENCOUNTER — Other Ambulatory Visit: Payer: Self-pay | Admitting: Oncology

## 2023-10-20 DIAGNOSIS — N6311 Unspecified lump in the right breast, upper outer quadrant: Secondary | ICD-10-CM

## 2023-10-21 ENCOUNTER — Ambulatory Visit
Admission: RE | Admit: 2023-10-21 | Discharge: 2023-10-21 | Disposition: A | Discharge status: Home / Self Care | Source: Ambulatory Visit | Attending: Oncology | Admitting: Oncology

## 2023-10-21 ENCOUNTER — Inpatient Hospital Stay: Attending: Oncology | Admitting: Hematology and Oncology

## 2023-10-21 ENCOUNTER — Other Ambulatory Visit: Payer: Self-pay | Admitting: Hematology and Oncology

## 2023-10-21 ENCOUNTER — Other Ambulatory Visit: Payer: Self-pay

## 2023-10-21 VITALS — BP 125/86 | HR 73 | Temp 97.8°F | Resp 16 | Ht 62.0 in | Wt 138.9 lb

## 2023-10-21 DIAGNOSIS — N6311 Unspecified lump in the right breast, upper outer quadrant: Secondary | ICD-10-CM

## 2023-10-21 DIAGNOSIS — M8580 Other specified disorders of bone density and structure, unspecified site: Secondary | ICD-10-CM | POA: Diagnosis not present

## 2023-10-21 DIAGNOSIS — N6314 Unspecified lump in the right breast, lower inner quadrant: Secondary | ICD-10-CM | POA: Insufficient documentation

## 2023-10-21 DIAGNOSIS — R9230 Dense breasts, unspecified: Secondary | ICD-10-CM | POA: Insufficient documentation

## 2023-10-21 DIAGNOSIS — N6091 Unspecified benign mammary dysplasia of right breast: Secondary | ICD-10-CM | POA: Insufficient documentation

## 2023-10-21 DIAGNOSIS — N6313 Unspecified lump in the right breast, lower outer quadrant: Secondary | ICD-10-CM | POA: Insufficient documentation

## 2023-10-21 NOTE — Progress Notes (Signed)
 [facility]  [address] [place],  [place]  [address] [phone] Patient Care Team: Benson Jessica Rung, NP as PCP - General (Internal Medicine) Gail Favorite, MD as Consulting Physician (General Surgery) Darcel Pool, MD as Consulting Physician (Obstetrics and Gynecology) Ivin Kocher, MD as Consulting Physician (Dermatology) Charlanne Groom, MD as Consulting Physician (Internal Medicine) Marieta Fairy Mt, MD as Referring Physician  Clinic Day: 10/21/2023  Referring physician:   ASSESSMENT & PLAN  ASSESSMENT:  High-Risk for Breast Cancer Her calculated risk is 32.7% or higher over her lifetime. Her first live birth was after age 33 and she has had a breast biopsy in 2017 and again in 2024. Her family history is unknown on her mother's side. Her breast density is category C. We therefore have her on annual MRI scans alternating with annual screening mammograms, usually 6 months apart. Her current MRI is stable. She was on Tamoxifen , started in March, 2019 for chemoprevention and completed in 2024.   Atypical Lobular Hyperplasia She had a biopsy and lumpectomy for this in November, 2017. The surgery showed no residual ALH.   Radial Scar This was found in mammogram of July, 2024 and she was referred to Dr. Bert. He performed a lumpectomy and there were focal changes compatible with but not diagnostic of residual complex sclerosing lesion. She also had fibrocystic changes and fat necrosis but no evidence of malignancy or premalignant changes.   Osteopenia  Her last bone density scan was done in July, 2024 with a T-score of the left femur -1.3.   Nodule of the Right Breast This is in the lower outer quadrant and corresponds to the area seen on MRI scan so is likely due to the biopsy from 6 months ago. MRI of bilateral breast in July, 2024 revealed a indeterminate enhancing mass in the lower inner right breast measuring 0.8 cm. She had a biopsy of the  right breast done and pathology on 08/07/2022 revealed a radial scar measuring 6 mm which appears completely contained within the biopsy. Pathology from the lumpectomy on 09/10/2022 revealed focal changes compatible but not diagnostic of scant residual complex sclerosing lesion, fibrocystic changes, fat necrosis, and was negative for carcinoma or in situ disease. MRI done on 02/09/2023 that revealed mild enhancement of the right breast which has been stable, also a rim enhancing centrally fat containing mass in the anterior depth measuring 1.6 cm of the lower outer quadrant of the right breast, this is compatible with postsurgical changes but no malignancy noted in either breast. Diagnostic mammogram done on 02/25/2023  revealed a 1.8 cm fat filled mass at the patient's surgical site in the lateral right breast consistent with benign fat necrosis. We will monitor this.   She now has an area of concern in the right upper quadrant. She is scheduled for diagnostic imaging today. This area is small and mobile. She denies pain and states she first noticed it a couple of weeks ago.   Plan: She completed 5 years of Tamoxifen  in Spring, 2024. She had a right breast diagnostic mammogram done on 02/25/2023 to further evaluate a right breast lump she felt. This revealed a 1.8 cm fat filled mass at the patient's surgical site in the lateral right breast consistent with benign fat necrosis. She will have repeat diagnostic imaging today of the right breast. She is scheduled to see Dr. Cornelius in January 2026. If appropriate after today's imaging, we will see her sooner.  The patient understands the plans discussed today  and is in agreement with them.  She knows to contact our office if she develops concerns prior to her next visit.   I provided 20 minutes of face-to-face time during this this encounter and > 50% was spent counseling as documented under my assessment and plan.    Jessica Bach, FNP- [facility]  [facility] - A DEPT OF [facility] [address] [place] [address] Dept: [phone] Dept Fax: [phone]    No orders of the defined types were placed in this encounter.  CHIEF COMPLAINT:  CC:   Atypical lobular hyperplasia; breast cancer high risk  Current Treatment:   Tamoxifen  started 2019; intensified screening  HISTORY OF PRESENT ILLNESS:  The patient had bilateral screening mammography 11/28/2015 showing new calcifications in both breasts.  She was set up for bilateral diagnostic mammography at the breast center 12/10/2015.  This found the breast density to be category C.  There were multiple areas of scattered microcalcifications in both breasts.  The largest individual group in the right breast measured 0.5 cm in the posterior upper outer quadrant.  In the left breast the largest group spanned 0.5 cm in the outer left breast.  In general these were all felt to be most likely benign and six-month follow-up versus biopsy was suggested. The patient opted for biopsy, which was performed 12/12/2015, and showed ([id number]) in the left breast, only fibrocystic changes with calcifications.  In the right breast however there was atypical lobular hyperplasia.  The patient was referred to surgery and after appropriate discussion with Dr. Gail she proceeded to right lumpectomy on 01/08/2016.  The final pathology here ([id number]) showed only fibrocystic changes.  Note that the biopsy site was present and the biopsy clip was identified. Her calculated her lifetime risk of breast cancer at 32.7%.   INTERVAL HISTORY:  Jessica Navarro returns today for follow up of her high risk for breast cancer and atypical lobular hyperplasia. Her calculated her lifetime risk of breast cancer is 32.7%. Patient had an MRI of bilateral breast without contrast that revealed a indeterminate enhancing mass in the lower inner right breast measuring 0.8 cm. She had a biopsy  of the right breast done and pathology on 08/07/2022 revealed a radial scar measuring 6 mm which appears completely contained within the biopsy. Pathology of the lumpectomy on 09/10/2022 revealed focal changes compatible but not diagnostic of scant residual complex sclerosing lesion, fibrocystic changes, fat necrosis, and was negative for carcinoma. Patient states that she feels well and has no complaints of pain. She completed 5 years of Tamoxifen  in Spring, 2024. She had a right breast diagnostic mammogram done on 02/25/2023 to further evaluate a right breast lump she felt. This revealed a 1.8 cm fat filled mass at the patient's surgical site in the lateral right breast consistent with benign fat necrosis. I will schedule a bilateral diagnostic mammogram now and she will be due for a bilateral MRI of the breast in January. She has a WBC of 6.2, hemoglobin of 13.6, platelet count of 276,000, and her CMP is completely normal. I will see her back in 6 months with bilateral breast MRI. She denies fever, chills, night sweats, or other signs of infection. She denies cardiorespiratory and gastrointestinal issues. She  denies pain. Her appetite is good and Her weight has decreased 57 pounds over last 6 months. She is on Zepbound 12.5 mg and has cut out high fat foods from her diet.  REVIEW OF SYSTEMS:  Review of Systems  Constitutional: Negative.  Negative for appetite change, chills, diaphoresis, fatigue, fever and unexpected weight change.  HENT:  Negative.  Negative for hearing loss, lump/mass, mouth sores, nosebleeds, sore throat, tinnitus, trouble swallowing and voice change.   Eyes: Negative.  Negative for eye problems and icterus.  Respiratory: Negative.  Negative for chest tightness, cough, hemoptysis, shortness of breath and wheezing.   Cardiovascular: Negative.  Negative for chest pain, leg swelling and palpitations.  Gastrointestinal: Negative.  Negative for abdominal distention, abdominal pain, blood  in stool, constipation, diarrhea, nausea, rectal pain and vomiting.  Endocrine: Negative.   Genitourinary: Negative.  Negative for bladder incontinence, difficulty urinating, dyspareunia, dysuria, frequency, hematuria, menstrual problem, nocturia, pelvic pain, vaginal bleeding and vaginal discharge.   Musculoskeletal:  Negative for arthralgias, back pain, flank pain, gait problem, myalgias, neck pain and neck stiffness.  Skin: Negative.  Negative for itching, rash and wound.  Neurological:  Negative for dizziness, extremity weakness, gait problem, headaches, light-headedness, numbness, seizures and speech difficulty.  Hematological: Negative.  Negative for adenopathy. Does not bruise/bleed easily.  Psychiatric/Behavioral: Negative.  Negative for confusion, decreased concentration, depression, sleep disturbance and suicidal ideas. The patient is not nervous/anxious.    VITALS:   Vitals:   10/21/23 0917  BP: 125/86  Pulse: 73  Resp: 16  Temp: 97.8 F (36.6 C)  SpO2: 98%   Body mass index is 25.41 kg/m.   Wt Readings from Last 3 Encounters:  10/21/23 138 lb 14.4 oz (63 kg)  08/19/23 155 lb 14.4 oz (70.7 kg)  02/19/23 212 lb 4.8 oz (96.3 kg)   Performance status (ECOG): 0 - Asymptomatic  PHYSICAL EXAM:  Physical Exam Vitals and nursing note reviewed.  Constitutional:      General: She is not in acute distress.    Appearance: Normal appearance. She is normal weight. She is not ill-appearing, toxic-appearing or diaphoretic.  HENT:     Head: Normocephalic and atraumatic.     Right Ear: Tympanic membrane, ear canal and external ear normal. There is no impacted cerumen.     Left Ear: Tympanic membrane, ear canal and external ear normal. There is no impacted cerumen.     Nose: Nose normal. No congestion or rhinorrhea.     Mouth/Throat:     Mouth: Mucous membranes are moist.     Pharynx: Oropharynx is clear. No oropharyngeal exudate or posterior oropharyngeal erythema.  Eyes:      General: No scleral icterus.       Right eye: No discharge.        Left eye: No discharge.     Extraocular Movements: Extraocular movements intact.     Conjunctiva/sclera: Conjunctivae normal.     Pupils: Pupils are equal, round, and reactive to light.  Neck:     Vascular: No carotid bruit.  Cardiovascular:     Rate and Rhythm: Normal rate and regular rhythm.     Pulses: Normal pulses.     Heart sounds: Normal heart sounds. No murmur heard.    No friction rub. No gallop.  Pulmonary:     Effort: Pulmonary effort is normal. No respiratory distress.     Breath sounds: Normal breath sounds. No stridor. No wheezing, rhonchi or rales.  Chest:     Chest wall: No tenderness.     Comments: Palpable nodule 1-2cm in the lower outer quadrant of the right breast in the subcutaneous tissue. (Firm and irregular) Bilateral implants are in place and appear normal.  No other masses in either breasts Abdominal:     General: Bowel sounds are normal. There is no distension.     Palpations: Abdomen is soft. There is no hepatomegaly, splenomegaly or mass.     Tenderness: There is no abdominal tenderness. There is no right CVA tenderness, left CVA tenderness, guarding or rebound.     Hernia: No hernia is present.  Musculoskeletal:        General: No swelling, tenderness, deformity or signs of injury. Normal range of motion.     Cervical back: Normal range of motion and neck supple. No rigidity or tenderness.     Right lower leg: No edema.     Left lower leg: No edema.  Lymphadenopathy:     Cervical: No cervical adenopathy.     Right cervical: No superficial, deep or posterior cervical adenopathy.    Left cervical: No superficial, deep or posterior cervical adenopathy.     Upper Body:     Right upper body: No supraclavicular, axillary or pectoral adenopathy.     Left upper body: No supraclavicular, axillary or pectoral adenopathy.  Skin:    General: Skin is warm and dry.     Coloration: Skin is not  jaundiced or pale.     Findings: No bruising, erythema, lesion or rash.  Neurological:     General: No focal deficit present.     Mental Status: She is alert and oriented to person, place, and time. Mental status is at baseline.     Cranial Nerves: No cranial nerve deficit.     Sensory: No sensory deficit.     Motor: No weakness.     Coordination: Coordination normal.     Gait: Gait normal.     Deep Tendon Reflexes: Reflexes normal.  Psychiatric:        Mood and Affect: Mood normal.        Behavior: Behavior normal.        Thought Content: Thought content normal.        Judgment: Judgment normal.    HISTORY:   Past Medical History:  Diagnosis Date   Abnormal pap    Allergy    Anxiety    Arthritis    back   Asthma    as a child   Atypical lobular hyperplasia (ALH) of right breast 01/08/2016   Bipolar disorder (HCC)    being treated for depression   Endometriosis    Headache    migraines   HSV-2 (herpes simplex virus 2) infection    Hypertension    Hypothyroidism    Infertility, female    Thyroid  disease    hypothyroidism  COVID 19 VACCINATION STATUS: Refuses vaccination; has had COVID January 2020 and August 2022 Past Surgical History:  Procedure Laterality Date   AUGMENTATION MAMMAPLASTY Bilateral    silicone on top of muscle   BREAST BIOPSY Left 12/12/2019   Fibrocystic changes   BREAST BIOPSY Right 12/12/2019   Fibrocystic changes with [facility]   BREAST EXCISIONAL BIOPSY Right 01/08/2020   Fibrocystic changes with [facility]   BREAST LUMPECTOMY WITH RADIOACTIVE SEED LOCALIZATION Right 01/08/2016   Procedure: RIGHT BREAST LUMPECTOMY WITH RADIOACTIVE SEED LOCALIZATION;  Surgeon: Elon Pacini, MD;  Location: [facility] OR;  Service: General;  Laterality: Right;   CESAREAN SECTION  2007   COLONOSCOPY     foot surgerey     SINUSOTOMY     Family History  Problem Relation Age of Onset   Fibromyalgia Mother    Alzheimer's disease  Paternal Grandmother    Stomach cancer Paternal  Grandfather 73   Breast cancer Neg Hx   The patient's mother was adopted.  She does not know her biological family.  She is 54 years old as of January 2019.  The patient's father is 4 year old as of January 2019.  Patient has 1 brother, no sisters.  There is no history of breast or ovarian cancer in the family to the patient's knowledge.  GYNECOLOGIC HISTORY:  No LMP recorded. (Menstrual status: IUD). 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  Their twins are Marit and Brule, [name] as of January 2019.  The patient attends a local [address]. She was previously a Interior and spatial designer for 16 years, until 2017  Social History   Tobacco Use   Smoking status: Never   Smokeless tobacco: Never  Vaping Use   Vaping status: Never Used  Substance Use Topics   Alcohol use: Yes    Alcohol/week: 1.0 - 2.0 standard drink of alcohol    Types: 1 - 2 Standard drinks or equivalent per week    Comment: occasional, 1-2 times a week, cocktail    Drug use: Never    Colonoscopy: Age 43/ [facility]  PAP: UTD/ [facility]  Bone density: None yet   Allergies  Allergen Reactions   Sulfa Antibiotics Nausea And Vomiting and Other (See Comments)    Other reaction(s): vomiting  Other reaction(s): GI intolerance, Vomiting   Current Outpatient Medications  Medication Sig Dispense Refill   ALPRAZolam (XANAX) 1 MG tablet Take 1 mg by mouth daily as needed (for anxiety).      ARIPiprazole (ABILIFY) 5 MG tablet Take 1 tablet by mouth once nightly.     butalbital-acetaminophen -caffeine (FIORICET, ESGIC) 50-325-40 MG per tablet Take 1-2 tablets by mouth every 6 (six) hours as needed (for migraine headaches.).      carvedilol (COREG) 3.125 MG tablet Take 3.125 mg by mouth 2 (two) times daily.      cyanocobalamin (,VITAMIN B-12,) 1000 MCG/ML injection Inject into the muscle every 30 (thirty) days.     levonorgestrel (MIRENA) 20 MCG/24HR IUD 1 each by Intrauterine route once.     levothyroxine (SYNTHROID) 75 MCG tablet Take 75 mcg by mouth daily.     omeprazole (PRILOSEC) 40 MG capsule Take 1 capsule by mouth 20 minutes before breakfast     ondansetron  (ZOFRAN -ODT) 4 MG disintegrating tablet Take 4 mg by mouth every 6 (six) hours as needed.     promethazine (PHENERGAN) 25 MG tablet Take 25 mg by mouth every 4 (four) hours as needed (for migraine induced nausea).      QULIPTA 60 MG TABS Take 1 tablet by mouth daily.     rizatriptan (MAXALT) 10 MG tablet Take 10 mg by mouth daily as needed (for migraine headaches.).      valACYclovir (VALTREX) 500 MG tablet Take 500 mg by mouth daily as needed. For cold sores.  0   vortioxetine HBr (TRINTELLIX) 10 MG TABS Take 10 mg by mouth at bedtime.     ZEPBOUND 12.5 MG/0.5ML Pen SMARTSIG:0.5 Milliliter(s) SUB-Q Once a Week     No current facility-administered medications for this visit.   LABS:   Lab Results  Component Value Date   WBC 6.2 08/19/2023   HGB 13.6  08/19/2023   HCT 43.0 08/19/2023   MCV 94.3 08/19/2023   PLT 276 08/19/2023   Lab Results  Component Value Date   CREATININE 0.71 08/19/2023   BUN 17 08/19/2023   NA 139 08/19/2023   K 4.5 08/19/2023   CL 103 08/19/2023   CO2 27 08/19/2023      Component Value Date/Time   PROT 6.9 08/19/2023 0823   PROT 7.4 02/26/2016 1218   ALBUMIN 4.1 08/19/2023 0823   ALBUMIN 4.1 02/26/2016 1218   AST 15 08/19/2023 0823   AST 20 02/26/2016 1218   ALT 12 08/19/2023 0823   ALT 32 02/26/2016 1218   ALKPHOS 83 08/19/2023 0823   ALKPHOS 85 02/26/2016 1218   BILITOT 0.3 08/19/2023 0823   BILITOT 0.49 02/26/2016 1218  No results found for: IRON, TIBC, FERRITIN Lab Results  Component Value Date   TSH 0.55 09/05/2013   THYROIDAB <10.0 03/07/2013   STUDIES  EXAM: 05/13/2023 DIGITAL  SCREENING BILATERAL MAMMOGRAM WITH IMPLANTS, CAD AND TOMOSYNTHESIS IMPRESSION: No mammographic evidence of malignancy. A result letter of this screening mammogram will be mailed directly to the patient. EXAM: 02/09/2023 BILATERAL BREAST MRI WITH AND WITHOUT CONTRAST IMPRESSION: No MRI evidence of malignancy in the bilateral breasts. RECOMMENDATION: 1.  Annual screening mammography is due in February of 2025. 2.  Annual high risk screening MRI in 1 year. BI-RADS CATEGORY  2: Benign.  EXAM: 08/12/2022 DG Bone Density The BMD measured at Femur Neck Left is 0.862 g/cm2 with a T-score of -1.3. This patient is considered osteopenic/low bone mass according to World Health Organization [facility]) criteria. The quality of the exam is good. The lumbar spine was excluded due to degenerative changes. Site Region Measured Date Measured Age YA BMD Significant CHANGE T-score DualFemur Neck Left 08/12/2022 52.6 -1.3 0.862 g/cm2 DualFemur Total Mean 08/12/2022 52.6 -0.4 0.953 g/cm2 Left Forearm Radius 33% 08/12/2022 52.6 0.2 0.895 g/cm2

## 2024-01-04 NOTE — Progress Notes (Signed)
 [address] - AMBULATORY [facility]  - [place]UROLOGY [place] [address] [place] [id number]   Date of Visit: 01/04/2024 Patient Name: Jessica Navarro Patient ID: [id number] Patient Primary Care:Patram Melissa Merlynn Delores Bonnet, NP  Chief complaint: Patient presents for Migraine (Patient presents today for migraine follow up, she states her medication is not controlling it well enough. She would like to discuss possible Botox injections for migraines. )     Data Reviewed   HPI:  53 year old female patient presents for follow-up appointment regarding migraine headaches.  She is currently being prescribed Qulipta 60 mg daily and Maxalt 10 mg as needed.  She reports that her migraines have worsened.  States that she has approximately 4 migraines a week that lasts from 1 to 3 days.  Can be located anywhere on head.  Pain is described as sharp and throbbing.  Associated symptoms include nausea, photophobia and phonophobia.  Patient denies aura. Patient is requesting to restart Botox.  States that she last took Botox approximately 5 years ago and states that it helped at that time.  Patient has tried Tylenol  (did not help), Advil (did not help), Excedrin (helped a little), Emgality (took for 3 months and states that it did not help), Aimovig (took of 3 months and states that it did not help), Topamax (not sure if it helped or not), Maxalt (helps) and butalbital (helps), Norvasc (taken for bp but stopped due to low bp), losartan, Coreg (for BP and has not helped migraines), hydrochlorothiazide (for bp and has not helped migraines), Trintellix (taken for something else and does not help migraines) and Abilify (does not help).  I am going to refill Qulipta and Maxalt.  I will place an order for Botox injections.  I am going to give patient a Toradol 30 mg IM injection and Depo-Medrol 80 mg IM.  Social History   Socioeconomic History    Marital status: Married    Spouse name: Not on file   Number of children: Not on file   Years of education: Not on file   Highest education level: Not on file  Occupational History   Not on file  Tobacco Use   Smoking status: Never   Smokeless tobacco: Never  Vaping Use   Vaping status: Never Used  Substance and Sexual Activity   Alcohol use: No   Drug use: No   Sexual activity: Yes    Partners: Male    Birth control/protection: Implant    Comment: Mirena  Other Topics Concern   Not on file  Social History Narrative   Not on file   Social Drivers of Health   Living Situation: Low Risk (11/18/2023)   Living Situation    What is your living situation today?: I have a steady place to live    Think about the place you live. Do you have problems with any of the following? Choose all that apply:: None/None on this list  Food Insecurity: Low Risk (11/18/2023)   Food vital sign    Within the past 12 months, you worried that your food would run out before you got money to buy more: Never true    Within the past 12 months, the food you bought just didn't last and you didn't have money to get more: Never true  Transportation Needs: No Transportation Needs (11/18/2023)   Transportation    In the past 12 months, has lack of reliable transportation kept you from medical appointments,  meetings, work or from getting things needed for daily living? : No  Utilities: Low Risk (11/18/2023)   Utilities    In the past 12 months has the electric, gas, oil, or water company threatened to shut off services in your home? : No  Safety: Low Risk (11/18/2023)   Safety    How often does anyone, including family and friends, physically hurt you?: Never    How often does anyone, including family and friends, insult or talk down to you?: Never    How often does anyone, including family and friends, threaten you with harm?: Never    How often does anyone, including family and  friends, scream or curse at you?: Never  Alcohol Screening: Not on file  Tobacco Use: Low Risk (01/04/2024)   Patient History    Smoking Tobacco Use: Never    Smokeless Tobacco Use: Never    Passive Exposure: Not on file  Depression: Not At Risk (01/04/2024)   PHQ-2    PHQ-2 Score: 0  Social Connections: Not on file  Financial Resource Strain: Low Risk  (01/29/2023)   Received from [facility]   Overall Financial Resource Strain (CARDIA)    Difficulty of Paying Living Expenses: Not hard at all    REVIEW OF SYSTEMS   CONSTITUTIONAL: No weight loss, fever, chills, weakness or fatigue.  HEENT: Eyes: No visual loss, blurred vision, double vision or yellow sclerae. Ears, Nose, Throat: No hearing loss, sneezing, congestion, runny nose or sore throat.  SKIN: No rash or itching.  CARDIOVASCULAR: No chest pain, chest pressure or chest discomfort. No palpitations or edema.  RESPIRATORY: No shortness of breath, cough or sputum.  GASTROINTESTINAL: No anorexia, nausea, vomiting or diarrhea. No abdominal pain or blood.  GENITOURINARY: Normal NEUROLOGICAL: No dizziness, syncope, paralysis, ataxia, numbness or tingling in the extremities. No change in bowel or bladder control. Migraine headaches MUSCULOSKELETAL: No muscle, back pain, joint pain or stiffness.  HEMATOLOGIC: No anemia, bleeding or bruising.  LYMPHATICS: No enlarged nodes. No history of splenectomy.  PSYCHIATRIC: No history of depression or anxiety.  ENDOCRINOLOGIC: No reports of sweating, cold or heat intolerance. No polyuria or polydipsia.  ALLERGIES: No history of asthma, hives, eczema or rhinitis.   Clinical Support on 12/23/2023  Component Date Value Ref Range Status   T4, Free 12/23/2023 0.8  0.6 - 1.1 ng/dL Final   T3, Total 88/73/7974 88 (L)  90 - 180 ng/dL Final     Vitals:   87/91/74 1200  BP: 112/76  Pulse: 81  SpO2: 97%   @PAINSC @ Body mass index is 23.96 kg/m.  PHYSICAL EXAM:   GEN:  Appears to be in no apparent distress HEENT: Normocephalic atraumatic, no abnormal bruising NECK: No carotid bruits CARDIAC: Regular rhythm  and rate.  No rubs, murmurs or gallops. LUNGS: Full expansion.  No wheezes or rhonchi. ABDOMEN: Nontender, nondistended SKIN: Normal  NEURO: The patient is alert.  Normal speech and language. Thought patterns are normal. Negative for tremors.  MUSCULOSKELETAL: Gait stable CRANIAL NERVES:Vision, visual fields are full.   Normal facial Symmetry. Uvula and palate upward bilaterally. Tongue midline. MOTOR: Normal tone and bulk.  No abnormal movements. REFLEXES: Toes bilaterally down.   Impression:  Migraine headaches   Plan:  Please see HPI  Call or go to ER if any changes.  Safety and side effect discussed.   I have personally spent 20 minutes involved in face-to-face and non-face-to-face activities for this patient on the day of the visit. Professional  time spent includes the following activities, counseling and coordination of care with regards to migraines, in addition to those noted in the documentation       Portions of this note were dictated using DRAGON voice recognition software. Please disregard any errors in transcription. This record has been created using Conservation officer, historic buildings. Errors have been sought and corrected, but may not always be located. Such creation errors do not reflect on the standard of medical care.

## 2024-01-19 NOTE — Telephone Encounter (Signed)
 New start w/ [facility].  Estimate not needed based on benefits.  Pt est cost $50   LMVM for pt about scheduling.   [place], RMA / Surgery Scheduler  01/19/2024 10:24 AM

## 2024-01-29 NOTE — Telephone Encounter (Signed)
 Patient needing PA started for the Botox please.

## 2024-02-19 ENCOUNTER — Ambulatory Visit
Admission: RE | Admit: 2024-02-19 | Discharge: 2024-02-19 | Disposition: A | Source: Ambulatory Visit | Attending: Oncology | Admitting: Oncology

## 2024-02-19 DIAGNOSIS — Z1239 Encounter for other screening for malignant neoplasm of breast: Secondary | ICD-10-CM

## 2024-02-19 MED ORDER — GADOPICLENOL 0.5 MMOL/ML IV SOLN
6.0000 mL | Freq: Once | INTRAVENOUS | Status: AC | PRN
  Administered 2024-02-19: 6 mL via INTRAVENOUS

## 2024-02-25 ENCOUNTER — Inpatient Hospital Stay: Admitting: Oncology

## 2024-02-25 DIAGNOSIS — Z9189 Other specified personal risk factors, not elsewhere classified: Secondary | ICD-10-CM

## 2024-02-25 NOTE — Progress Notes (Incomplete)
 " [facility]  [address] [place],  [place]  [address] [phone] Patient Care Team: Benson Eleanor Rung, NP as PCP - General (Internal Medicine) Gail Favorite, MD as Consulting Physician (General Surgery) Darcel Pool, MD as Consulting Physician (Obstetrics and Gynecology) Ivin Kocher, MD as Consulting Physician (Dermatology) Charlanne Groom, MD as Consulting Physician (Internal Medicine) Marieta Fairy Mt, MD as Referring Physician  Clinic Day: 02/25/2024    Referring physician: Eleanor Benson, NP  ASSESSMENT & PLAN  Assessment:  High-Risk for Breast Cancer Her calculated risk is 32.7% or higher over her lifetime. Her first live birth was after age 62 and she has had a breast biopsy in 2017 and again in 2024. Her family history is unknown on her mother's side. Her breast density is category C. We therefore have her on annual MRI scans alternating with annual screening mammograms, usually 6 months apart. Her current MRI is stable. She was on Tamoxifen , started in March, 2019 for chemoprevention and completed in 2024.   Atypical Lobular Hyperplasia She had a biopsy and lumpectomy for this in November, 2017. The surgery showed no residual ALH.   Radial Scar This was found in mammogram of July, 2024 and she was referred to Dr. Bert. He performed a lumpectomy and there were focal changes compatible with but not diagnostic of residual complex sclerosing lesion. She also had fibrocystic changes and fat necrosis but no evidence of malignancy or premalignant changes.   Osteopenia  Her last bone density scan was done in July, 2024 with a T-score of the left femur -1.3.   Nodule of the Right Breast This is in the lower outer quadrant and corresponds to the area seen on MRI scan so is likely due to the biopsy from 6 months ago. MRI of bilateral breast in July, 2024 revealed a indeterminate enhancing mass in the lower inner right breast measuring 0.8  cm. She had a biopsy of the right breast done and pathology on 08/07/2022 revealed a radial scar measuring 6 mm which appears completely contained within the biopsy. Pathology from the lumpectomy on 09/10/2022 revealed focal changes compatible but not diagnostic of scant residual complex sclerosing lesion, fibrocystic changes, fat necrosis, and was negative for carcinoma or in situ disease. MRI done on 02/09/2023 that revealed mild enhancement of the right breast which has been stable, also a rim enhancing centrally fat containing mass in the anterior depth measuring 1.6 cm of the lower outer quadrant of the right breast, this is compatible with postsurgical changes but no malignancy noted in either breast. Diagnostic mammogram done on 02/25/2023  revealed a 1.8 cm fat filled mass at the patient's surgical site in the lateral right breast consistent with benign fat necrosis. We will monitor this.   Plan:   She completed 5 years of Tamoxifen  in Spring, 2024. She had a right breast diagnostic mammogram done on 02/25/2023 to further evaluate a right breast lump she felt. This revealed a 1.8 cm fat filled mass at the patient's surgical site in the lateral right breast consistent with benign fat necrosis. I will schedule a bilateral diagnostic mammogram now and she will be due for an bilateral MRI of the breast in January. She has a WBC of 6.2, hemoglobin of 13.6, platelet count of 276,000, and her CMP is completely normal. I will see her back in 6 months with bilateral breast MRI.   The patient understands the plans discussed today and is in agreement with them.  She knows to contact  our office if she develops concerns prior to her next visit.   I provided *** minutes of face-to-face time during this this encounter and > 50% was spent counseling as documented under my assessment and plan.    Wanda VEAR Cornish, MD  [facility] OF [facility] [address] [place] [address] Dept: [phone] Dept Fax: [phone]   No orders of the defined types were placed in this encounter.  CHIEF COMPLAINT:  CC:   Atypical lobular hyperplasia; breast cancer high risk  Current Treatment:   Tamoxifen  started 2019; intensified screening  HISTORY OF PRESENT ILLNESS:  The patient had bilateral screening mammography 11/28/2015 showing new calcifications in both breasts.  She was set up for bilateral diagnostic mammography at the breast center 12/10/2015.  This found the breast density to be category C.  There were multiple areas of scattered microcalcifications in both breasts.  The largest individual group in the right breast measured 0.5 cm in the posterior upper outer quadrant.  In the left breast the largest group spanned 0.5 cm in the outer left breast.  In general these were all felt to be most likely benign and six-month follow-up versus biopsy was suggested. The patient opted for biopsy, which was performed 12/12/2015, and showed ([id number]) in the left breast, only fibrocystic changes with calcifications.  In the right breast however there was atypical lobular hyperplasia.  The patient was referred to surgery and after appropriate discussion with Dr. Gail she proceeded to right lumpectomy on 01/08/2016.  The final pathology here ([id number]) showed only fibrocystic changes.  Note that the biopsy site was present and the biopsy clip was identified. Her calculated her lifetime risk of breast cancer at 32.7%.   INTERVAL HISTORY:  Jessica Navarro returns today for follow up of her high risk for breast cancer and atypical lobular hyperplasia. Her calculated her lifetime risk of breast cancer is 32.7%. Patient had an MRI of bilateral breast without contrast that revealed a indeterminate enhancing mass in the lower inner right breast measuring 0.8 cm. She had a biopsy of the right breast done and pathology on 08/07/2022 revealed a radial scar  measuring 6 mm which appears completely contained within the biopsy. Pathology of the lumpectomy on 09/10/2022 revealed focal changes compatible but not diagnostic of scant residual complex sclerosing lesion, fibrocystic changes, fat necrosis, and was negative for carcinoma. Patient states that she feels *** and ***.    Her unilateral diagnostic mammogram with ultrasound on 10/21/2023 revealed no evidence of malignancy in the right breast. She had a bilateral breast MRI on 02/19/2024 which revealed no MRI evidence of malignancy in either breast and stable benign post surgical changes with the exception of a decrease in size of an area of benign fat necrosis.      She has a WBC of ***, hemoglobin of ***, and platelet count of ***.      I will see her back in *** with ***.    She denies fever, chills, night sweats, or other signs of infection. She denies cardiorespiratory and gastrointestinal issues. She  denies pain. Her appetite is *** and Her weight {Weight change:10426}.  Wt Readings from Last 3 Encounters:  10/21/23 138 lb 14.4 oz (63 kg)  08/19/23 155 lb 14.4 oz (70.7 kg)  02/19/23 212 lb 4.8 oz (96.3 kg)   Patient states that she feels well and has no complaints of pain. She completed 5  years of Tamoxifen  in Spring, 2024. She had a right breast diagnostic mammogram done on 02/25/2023 to further evaluate a right breast lump she felt. This revealed a 1.8 cm fat filled mass at the patient's surgical site in the lateral right breast consistent with benign fat necrosis. I will schedule a bilateral diagnostic mammogram now and she will be due for a bilateral MRI of the breast in January. She has a WBC of 6.2, hemoglobin of 13.6, platelet count of 276,000, and her CMP is completely normal. I will see her back in 6 months with bilateral breast MRI. She denies fever, chills, night sweats, or other signs of infection. She denies cardiorespiratory and gastrointestinal issues. She  denies pain. Her  appetite is good and Her weight has decreased 57 pounds over last 6 months. She is on Zepbound 12.5 mg and has cut out high fat foods from her diet.   REVIEW OF SYSTEMS:  Review of Systems  Constitutional: Negative.  Negative for appetite change, chills, diaphoresis, fatigue, fever and unexpected weight change.  HENT:  Negative.  Negative for hearing loss, lump/mass, mouth sores, nosebleeds, sore throat, tinnitus, trouble swallowing and voice change.   Eyes: Negative.  Negative for eye problems and icterus.  Respiratory: Negative.  Negative for chest tightness, cough, hemoptysis, shortness of breath and wheezing.   Cardiovascular: Negative.  Negative for chest pain, leg swelling and palpitations.  Gastrointestinal: Negative.  Negative for abdominal distention, abdominal pain, blood in stool, constipation, diarrhea, nausea, rectal pain and vomiting.  Endocrine: Negative.   Genitourinary: Negative.  Negative for bladder incontinence, difficulty urinating, dyspareunia, dysuria, frequency, hematuria, menstrual problem, nocturia, pelvic pain, vaginal bleeding and vaginal discharge.   Musculoskeletal:  Negative for arthralgias, back pain, flank pain, gait problem, myalgias, neck pain and neck stiffness.  Skin: Negative.  Negative for itching, rash and wound.  Neurological:  Negative for dizziness, extremity weakness, gait problem, headaches, light-headedness, numbness, seizures and speech difficulty.  Hematological: Negative.  Negative for adenopathy. Does not bruise/bleed easily.  Psychiatric/Behavioral: Negative.  Negative for confusion, decreased concentration, depression, sleep disturbance and suicidal ideas. The patient is not nervous/anxious.    VITALS:   There were no vitals filed for this visit.  There is no height or weight on file to calculate BMI.   Wt Readings from Last 3 Encounters:  10/21/23 138 lb 14.4 oz (63 kg)  08/19/23 155 lb 14.4 oz (70.7 kg)  02/19/23 212 lb 4.8 oz (96.3 kg)    Performance status (ECOG): 0 - Asymptomatic  PHYSICAL EXAM:  Physical Exam Vitals and nursing note reviewed.  Constitutional:      General: She is not in acute distress.    Appearance: Normal appearance. She is normal weight. She is not ill-appearing, toxic-appearing or diaphoretic.  HENT:     Head: Normocephalic and atraumatic.     Right Ear: Tympanic membrane, ear canal and external ear normal. There is no impacted cerumen.     Left Ear: Tympanic membrane, ear canal and external ear normal. There is no impacted cerumen.     Nose: Nose normal. No congestion or rhinorrhea.     Mouth/Throat:     Mouth: Mucous membranes are moist.     Pharynx: Oropharynx is clear. No oropharyngeal exudate or posterior oropharyngeal erythema.  Eyes:     General: No scleral icterus.       Right eye: No discharge.        Left eye: No discharge.     Extraocular Movements: Extraocular movements  intact.     Conjunctiva/sclera: Conjunctivae normal.     Pupils: Pupils are equal, round, and reactive to light.  Neck:     Vascular: No carotid bruit.  Cardiovascular:     Rate and Rhythm: Normal rate and regular rhythm.     Pulses: Normal pulses.     Heart sounds: Normal heart sounds. No murmur heard.    No friction rub. No gallop.  Pulmonary:     Effort: Pulmonary effort is normal. No respiratory distress.     Breath sounds: Normal breath sounds. No stridor. No wheezing, rhonchi or rales.  Chest:     Chest wall: No tenderness.     Comments: Palpable nodule 1-2cm in the lower outer quadrant of the right breast in the subcutaneous tissue. (Firm and irregular) Bilateral implants are in place and appear normal.  No other masses in either breasts Abdominal:     General: Bowel sounds are normal. There is no distension.     Palpations: Abdomen is soft. There is no hepatomegaly, splenomegaly or mass.     Tenderness: There is no abdominal tenderness. There is no right CVA tenderness, left CVA tenderness,  guarding or rebound.     Hernia: No hernia is present.  Musculoskeletal:        General: No swelling, tenderness, deformity or signs of injury. Normal range of motion.     Cervical back: Normal range of motion and neck supple. No rigidity or tenderness.     Right lower leg: No edema.     Left lower leg: No edema.  Lymphadenopathy:     Cervical: No cervical adenopathy.     Right cervical: No superficial, deep or posterior cervical adenopathy.    Left cervical: No superficial, deep or posterior cervical adenopathy.     Upper Body:     Right upper body: No supraclavicular, axillary or pectoral adenopathy.     Left upper body: No supraclavicular, axillary or pectoral adenopathy.  Skin:    General: Skin is warm and dry.     Coloration: Skin is not jaundiced or pale.     Findings: No bruising, erythema, lesion or rash.  Neurological:     General: No focal deficit present.     Mental Status: She is alert and oriented to person, place, and time. Mental status is at baseline.     Cranial Nerves: No cranial nerve deficit.     Sensory: No sensory deficit.     Motor: No weakness.     Coordination: Coordination normal.     Gait: Gait normal.     Deep Tendon Reflexes: Reflexes normal.  Psychiatric:        Mood and Affect: Mood normal.        Behavior: Behavior normal.        Thought Content: Thought content normal.        Judgment: Judgment normal.    HISTORY:   Past Medical History:  Diagnosis Date   Abnormal pap    Allergy    Anxiety    Arthritis    back   Asthma    as a child   Atypical lobular hyperplasia (ALH) of right breast 01/08/2016   Bipolar disorder (HCC)    being treated for depression   Endometriosis    Headache    migraines   HSV-2 (herpes simplex virus 2) infection    Hypertension    Hypothyroidism    Infertility, female    Thyroid  disease    hypothyroidism  COVID 19 VACCINATION STATUS: Refuses vaccination; has had COVID January 2020 and August 2022 Past  Surgical History:  Procedure Laterality Date   AUGMENTATION MAMMAPLASTY Bilateral    silicone on top of muscle   BREAST BIOPSY Left 12/12/2019   Fibrocystic changes   BREAST BIOPSY Right 12/12/2019   Fibrocystic changes with [facility]   BREAST EXCISIONAL BIOPSY Right 01/08/2020   Fibrocystic changes with [facility]   BREAST LUMPECTOMY WITH RADIOACTIVE SEED LOCALIZATION Right 01/08/2016   Procedure: RIGHT BREAST LUMPECTOMY WITH RADIOACTIVE SEED LOCALIZATION;  Surgeon: Elon Pacini, MD;  Location: [facility] OR;  Service: General;  Laterality: Right;   CESAREAN SECTION  2007   COLONOSCOPY     foot surgerey     SINUSOTOMY     Family History  Problem Relation Age of Onset   Fibromyalgia Mother    Alzheimer's disease Paternal Grandmother    Stomach cancer Paternal Grandfather 60   Breast cancer Neg Hx   The patient's mother was adopted.  She does not know her biological family.  She is 54 years old as of January 2019.  The patient's father is 73 year old as of January 2019.  Patient has 1 brother, no sisters.  There is no history of breast or ovarian cancer in the family to the patient's knowledge.  GYNECOLOGIC HISTORY:  No LMP recorded. (Menstrual status: IUD). 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  Their twins are [place] and [place], [name] as of January 2019.  The patient attends a local [address]. She was previously a interior and spatial designer for 16 years, until 2017  Social History   Tobacco Use   Smoking status: Never   Smokeless tobacco: Never  Vaping Use   Vaping status: Never Used  Substance Use Topics   Alcohol use: Yes    Alcohol/week: 1.0 - 2.0 standard drink of alcohol    Types: 1 - 2 Standard drinks or equivalent per week    Comment: occasional, 1-2 times a week,  cocktail    Drug use: Never    Colonoscopy: Age 25/ [facility]  PAP: UTD/ [facility]  Bone density: None yet   Allergies  Allergen Reactions   Sulfa Antibiotics Nausea And Vomiting and Other (See Comments)    Other reaction(s): vomiting  Other reaction(s): GI intolerance, Vomiting   Current Outpatient Medications  Medication Sig Dispense Refill   ALPRAZolam (XANAX) 1 MG tablet Take 1 mg by mouth daily as needed (for anxiety).      ARIPiprazole (ABILIFY) 5 MG tablet Take 1 tablet by mouth once nightly.     butalbital-acetaminophen -caffeine (FIORICET, ESGIC) 50-325-40 MG per tablet Take 1-2 tablets by mouth every 6 (six) hours as needed (for migraine headaches.).      carvedilol (COREG) 3.125 MG tablet Take 3.125 mg by mouth 2 (two) times daily.     cyanocobalamin (,VITAMIN B-12,) 1000 MCG/ML injection Inject into the muscle every 30 (thirty) days.     levonorgestrel (MIRENA) 20 MCG/24HR IUD 1 each by Intrauterine route once.     levothyroxine (SYNTHROID) 75 MCG tablet Take 75 mcg by mouth daily.     omeprazole (PRILOSEC) 40 MG capsule Take 1 capsule by mouth 20 minutes before breakfast     ondansetron  (ZOFRAN -ODT) 4 MG disintegrating tablet Take 4 mg by mouth every 6 (six)  hours as needed.     promethazine (PHENERGAN) 25 MG tablet Take 25 mg by mouth every 4 (four) hours as needed (for migraine induced nausea).      QULIPTA 60 MG TABS Take 1 tablet by mouth daily.     rizatriptan (MAXALT) 10 MG tablet Take 10 mg by mouth daily as needed (for migraine headaches.).      valACYclovir (VALTREX) 500 MG tablet Take 500 mg by mouth daily as needed. For cold sores.  0   vortioxetine HBr (TRINTELLIX) 10 MG TABS Take 10 mg by mouth at bedtime.     ZEPBOUND 12.5 MG/0.5ML Pen SMARTSIG:0.5 Milliliter(s) SUB-Q Once a Week     No current facility-administered medications for this visit.   LABS:   Lab Results  Component Value Date   WBC 6.2 08/19/2023   HGB 13.6 08/19/2023   HCT 43.0 08/19/2023   MCV  94.3 08/19/2023   PLT 276 08/19/2023   Lab Results  Component Value Date   CREATININE 0.71 08/19/2023   BUN 17 08/19/2023   NA 139 08/19/2023   K 4.5 08/19/2023   CL 103 08/19/2023   CO2 27 08/19/2023      Component Value Date/Time   PROT 6.9 08/19/2023 0823   PROT 7.4 02/26/2016 1218   ALBUMIN 4.1 08/19/2023 0823   ALBUMIN 4.1 02/26/2016 1218   AST 15 08/19/2023 0823   AST 20 02/26/2016 1218   ALT 12 08/19/2023 0823   ALT 32 02/26/2016 1218   ALKPHOS 83 08/19/2023 0823   ALKPHOS 85 02/26/2016 1218   BILITOT 0.3 08/19/2023 0823   BILITOT 0.49 02/26/2016 1218  No results found for: IRON, TIBC, FERRITIN Lab Results  Component Value Date   TSH 0.55 09/05/2013   THYROIDAB <10.0 03/07/2013   STUDIES  EXAM: 02/19/2024 BILATERAL BREAST MRI WITH AND WITHOUT CONTRAST IMPRESSION: 1. No MRI evidence malignancy in either breast. 2. Stable benign post surgical changes, with the exception of a decrease in size of an area of benign fat necrosis.  EXAM: 10/21/2023 DIGITAL DIAGNOSTIC UNILATERAL RIGHT MAMMOGRAM WITH IMPLANTS, TOMOSYNTHESIS AND CAD; ULTRASOUND RIGHT BREAST LIMITED IMPRESSION: No evidence of malignancy in the right breast.  EXAM: 05/13/2023 DIGITAL SCREENING BILATERAL MAMMOGRAM WITH IMPLANTS, CAD AND TOMOSYNTHESIS IMPRESSION: No mammographic evidence of malignancy. A result letter of this screening mammogram will be mailed directly to the patient. EXAM: 02/09/2023 BILATERAL BREAST MRI WITH AND WITHOUT CONTRAST IMPRESSION: No MRI evidence of malignancy in the bilateral breasts. RECOMMENDATION: 1.  Annual screening mammography is due in February of 2025. 2.  Annual high risk screening MRI in 1 year. BI-RADS CATEGORY  2: Benign.  EXAM: 08/12/2022 DG Bone Density The BMD measured at Femur Neck Left is 0.862 g/cm2 with a T-score of -1.3. This patient is considered osteopenic/low bone mass according to World Health Organization [facility]) criteria. The quality  of the exam is good. The lumbar spine was excluded due to degenerative changes. Site Region Measured Date Measured Age YA BMD Significant CHANGE T-score DualFemur Neck Left 08/12/2022 52.6 -1.3 0.862 g/cm2 DualFemur Total Mean 08/12/2022 52.6 -0.4 0.953 g/cm2 Left Forearm Radius 33% 08/12/2022 52.6 0.2 0.895 g/cm2  I, Aretta Cook, acting as a scribe for Wanda VEAR Cornish, MD, have documented all relevant documentation on the behalf of Wanda VEAR Cornish, MD, as directed by Wanda VEAR Cornish, MD, while in the presence of Wanda VEAR Cornish, MD.  I, Wanda VEAR Cornish, MD , have reviewed this report as typed by the medical scribe, and it  is complete and accurate.  "

## 2024-03-11 ENCOUNTER — Inpatient Hospital Stay: Admitting: Oncology
